# Patient Record
Sex: Female | Born: 1940 | Race: White | Hispanic: No | Smoking: Former smoker
Health system: Southern US, Community
[De-identification: ages and names within clinical notes are randomized; demographics above are authoritative.]

## PROBLEM LIST (undated history)

## (undated) ENCOUNTER — Ambulatory Visit: Discharge status: Absent without leave | Payer: Medicare Other

## (undated) DIAGNOSIS — N809 Endometriosis, unspecified: Secondary | ICD-10-CM

## (undated) DIAGNOSIS — K589 Irritable bowel syndrome without diarrhea: Secondary | ICD-10-CM

## (undated) DIAGNOSIS — E559 Vitamin D deficiency, unspecified: Secondary | ICD-10-CM

## (undated) DIAGNOSIS — Z79899 Other long term (current) drug therapy: Secondary | ICD-10-CM

## (undated) DIAGNOSIS — E039 Hypothyroidism, unspecified: Secondary | ICD-10-CM

## (undated) DIAGNOSIS — I10 Essential (primary) hypertension: Secondary | ICD-10-CM

## (undated) DIAGNOSIS — R42 Dizziness and giddiness: Secondary | ICD-10-CM

## (undated) DIAGNOSIS — H3530 Unspecified macular degeneration: Secondary | ICD-10-CM

## (undated) HISTORY — PX: THYROIDECTOMY: SHX17

## (undated) HISTORY — DX: Endometriosis, unspecified: N80.9

## (undated) HISTORY — DX: Unspecified macular degeneration: H35.30

## (undated) HISTORY — DX: Dizziness and giddiness: R42

## (undated) HISTORY — DX: Vitamin D deficiency, unspecified: E55.9

## (undated) HISTORY — PX: ABDOMINAL HYSTERECTOMY: SHX81

## (undated) HISTORY — DX: Other long term (current) drug therapy: Z79.899

## (undated) HISTORY — DX: Essential (primary) hypertension: I10

## (undated) HISTORY — PX: CHOLECYSTECTOMY: SHX55

## (undated) HISTORY — PX: BREAST BIOPSY: SHX20

## (undated) HISTORY — PX: FOOT SURGERY: SHX648

---

## 1997-08-15 ENCOUNTER — Ambulatory Visit (HOSPITAL_COMMUNITY): Admission: RE | Admit: 1997-08-15 | Discharge: 1997-08-15 | Payer: Self-pay | Admitting: Obstetrics & Gynecology

## 1997-10-03 ENCOUNTER — Other Ambulatory Visit: Admission: RE | Admit: 1997-10-03 | Discharge: 1997-10-03 | Payer: Self-pay | Admitting: Otolaryngology

## 1997-10-23 ENCOUNTER — Ambulatory Visit (HOSPITAL_COMMUNITY): Admission: RE | Admit: 1997-10-23 | Discharge: 1997-10-23 | Payer: Self-pay | Admitting: Hematology and Oncology

## 1998-06-28 ENCOUNTER — Ambulatory Visit (HOSPITAL_COMMUNITY): Admission: RE | Admit: 1998-06-28 | Discharge: 1998-06-29 | Payer: Self-pay | Admitting: Otolaryngology

## 1999-12-30 ENCOUNTER — Encounter: Admission: RE | Admit: 1999-12-30 | Discharge: 1999-12-30 | Payer: Self-pay | Admitting: *Deleted

## 1999-12-30 ENCOUNTER — Encounter: Payer: Self-pay | Admitting: *Deleted

## 2000-01-05 ENCOUNTER — Encounter: Payer: Self-pay | Admitting: *Deleted

## 2000-01-05 ENCOUNTER — Encounter: Admission: RE | Admit: 2000-01-05 | Discharge: 2000-01-05 | Payer: Self-pay | Admitting: *Deleted

## 2001-01-05 ENCOUNTER — Encounter: Payer: Self-pay | Admitting: *Deleted

## 2001-01-05 ENCOUNTER — Encounter: Admission: RE | Admit: 2001-01-05 | Discharge: 2001-01-05 | Payer: Self-pay | Admitting: *Deleted

## 2001-08-11 ENCOUNTER — Ambulatory Visit (HOSPITAL_COMMUNITY): Admission: RE | Admit: 2001-08-11 | Discharge: 2001-08-11 | Payer: Self-pay | Admitting: Family Medicine

## 2001-08-11 ENCOUNTER — Encounter: Payer: Self-pay | Admitting: Family Medicine

## 2001-12-05 ENCOUNTER — Encounter: Payer: Self-pay | Admitting: General Surgery

## 2001-12-05 ENCOUNTER — Ambulatory Visit (HOSPITAL_COMMUNITY): Admission: RE | Admit: 2001-12-05 | Discharge: 2001-12-05 | Payer: Self-pay | Admitting: General Surgery

## 2002-01-02 ENCOUNTER — Ambulatory Visit (HOSPITAL_COMMUNITY): Admission: RE | Admit: 2002-01-02 | Discharge: 2002-01-02 | Payer: Self-pay | Admitting: General Surgery

## 2002-02-08 ENCOUNTER — Encounter: Admission: RE | Admit: 2002-02-08 | Discharge: 2002-02-08 | Payer: Self-pay | Admitting: Family Medicine

## 2002-02-08 ENCOUNTER — Encounter: Payer: Self-pay | Admitting: Family Medicine

## 2003-02-13 ENCOUNTER — Encounter: Payer: Self-pay | Admitting: Family Medicine

## 2003-02-13 ENCOUNTER — Encounter: Admission: RE | Admit: 2003-02-13 | Discharge: 2003-02-13 | Payer: Self-pay | Admitting: Family Medicine

## 2003-04-16 ENCOUNTER — Ambulatory Visit (HOSPITAL_COMMUNITY): Admission: RE | Admit: 2003-04-16 | Discharge: 2003-04-16 | Payer: Self-pay | Admitting: Internal Medicine

## 2003-04-16 ENCOUNTER — Encounter (INDEPENDENT_AMBULATORY_CARE_PROVIDER_SITE_OTHER): Payer: Self-pay | Admitting: Internal Medicine

## 2003-08-17 ENCOUNTER — Ambulatory Visit (HOSPITAL_COMMUNITY): Admission: RE | Admit: 2003-08-17 | Discharge: 2003-08-17 | Payer: Self-pay | Admitting: Family Medicine

## 2003-12-31 ENCOUNTER — Ambulatory Visit (HOSPITAL_COMMUNITY): Admission: RE | Admit: 2003-12-31 | Discharge: 2003-12-31 | Payer: Self-pay | Admitting: Orthopaedic Surgery

## 2004-02-14 ENCOUNTER — Encounter: Admission: RE | Admit: 2004-02-14 | Discharge: 2004-02-14 | Payer: Self-pay | Admitting: Obstetrics and Gynecology

## 2004-09-19 ENCOUNTER — Ambulatory Visit: Payer: Self-pay | Admitting: Internal Medicine

## 2004-09-24 ENCOUNTER — Ambulatory Visit (HOSPITAL_COMMUNITY): Admission: RE | Admit: 2004-09-24 | Discharge: 2004-09-24 | Payer: Self-pay | Admitting: Internal Medicine

## 2004-10-21 ENCOUNTER — Ambulatory Visit: Payer: Self-pay | Admitting: Internal Medicine

## 2005-02-26 ENCOUNTER — Encounter: Admission: RE | Admit: 2005-02-26 | Discharge: 2005-02-26 | Payer: Self-pay | Admitting: Obstetrics and Gynecology

## 2005-10-02 ENCOUNTER — Ambulatory Visit: Payer: Self-pay | Admitting: Internal Medicine

## 2005-10-08 ENCOUNTER — Ambulatory Visit (HOSPITAL_COMMUNITY): Admission: RE | Admit: 2005-10-08 | Discharge: 2005-10-08 | Payer: Self-pay | Admitting: Internal Medicine

## 2005-11-26 ENCOUNTER — Ambulatory Visit: Payer: Self-pay | Admitting: Internal Medicine

## 2005-11-26 ENCOUNTER — Ambulatory Visit (HOSPITAL_COMMUNITY): Admission: RE | Admit: 2005-11-26 | Discharge: 2005-11-26 | Payer: Self-pay | Admitting: Internal Medicine

## 2006-03-09 ENCOUNTER — Encounter: Admission: RE | Admit: 2006-03-09 | Discharge: 2006-03-09 | Payer: Self-pay | Admitting: Obstetrics and Gynecology

## 2006-05-18 ENCOUNTER — Ambulatory Visit: Payer: Self-pay | Admitting: Internal Medicine

## 2006-05-31 ENCOUNTER — Ambulatory Visit: Payer: Self-pay | Admitting: Internal Medicine

## 2007-01-13 ENCOUNTER — Ambulatory Visit: Payer: Self-pay | Admitting: Gastroenterology

## 2007-03-14 ENCOUNTER — Encounter: Admission: RE | Admit: 2007-03-14 | Discharge: 2007-03-14 | Payer: Self-pay | Admitting: Obstetrics and Gynecology

## 2007-04-04 ENCOUNTER — Encounter (INDEPENDENT_AMBULATORY_CARE_PROVIDER_SITE_OTHER): Payer: Self-pay | Admitting: Internal Medicine

## 2007-05-05 ENCOUNTER — Ambulatory Visit: Payer: Self-pay | Admitting: Internal Medicine

## 2007-12-08 ENCOUNTER — Ambulatory Visit: Payer: Self-pay | Admitting: Gastroenterology

## 2007-12-15 ENCOUNTER — Encounter: Payer: Self-pay | Admitting: Gastroenterology

## 2007-12-15 ENCOUNTER — Ambulatory Visit: Payer: Self-pay | Admitting: Gastroenterology

## 2007-12-15 ENCOUNTER — Ambulatory Visit (HOSPITAL_COMMUNITY): Admission: RE | Admit: 2007-12-15 | Discharge: 2007-12-15 | Payer: Self-pay | Admitting: Gastroenterology

## 2008-04-17 ENCOUNTER — Encounter: Admission: RE | Admit: 2008-04-17 | Discharge: 2008-04-17 | Payer: Self-pay | Admitting: Obstetrics and Gynecology

## 2008-04-27 ENCOUNTER — Other Ambulatory Visit: Admission: RE | Admit: 2008-04-27 | Discharge: 2008-04-27 | Payer: Self-pay | Admitting: Obstetrics and Gynecology

## 2008-05-01 ENCOUNTER — Ambulatory Visit (HOSPITAL_COMMUNITY): Admission: RE | Admit: 2008-05-01 | Discharge: 2008-05-01 | Payer: Self-pay | Admitting: Obstetrics & Gynecology

## 2008-05-08 ENCOUNTER — Ambulatory Visit (HOSPITAL_COMMUNITY): Admission: RE | Admit: 2008-05-08 | Discharge: 2008-05-08 | Payer: Self-pay | Admitting: Obstetrics & Gynecology

## 2008-11-05 ENCOUNTER — Ambulatory Visit: Payer: Self-pay | Admitting: Internal Medicine

## 2008-11-05 DIAGNOSIS — M545 Low back pain, unspecified: Secondary | ICD-10-CM | POA: Insufficient documentation

## 2008-11-05 DIAGNOSIS — M1990 Unspecified osteoarthritis, unspecified site: Secondary | ICD-10-CM | POA: Insufficient documentation

## 2008-11-05 DIAGNOSIS — M542 Cervicalgia: Secondary | ICD-10-CM | POA: Insufficient documentation

## 2008-11-05 DIAGNOSIS — E039 Hypothyroidism, unspecified: Secondary | ICD-10-CM | POA: Insufficient documentation

## 2008-11-05 DIAGNOSIS — K219 Gastro-esophageal reflux disease without esophagitis: Secondary | ICD-10-CM | POA: Insufficient documentation

## 2008-11-05 DIAGNOSIS — J309 Allergic rhinitis, unspecified: Secondary | ICD-10-CM | POA: Insufficient documentation

## 2008-11-06 ENCOUNTER — Ambulatory Visit (HOSPITAL_COMMUNITY): Admission: RE | Admit: 2008-11-06 | Discharge: 2008-11-06 | Payer: Self-pay | Admitting: Internal Medicine

## 2009-01-08 ENCOUNTER — Encounter (INDEPENDENT_AMBULATORY_CARE_PROVIDER_SITE_OTHER): Payer: Self-pay | Admitting: Internal Medicine

## 2009-04-12 ENCOUNTER — Emergency Department (HOSPITAL_COMMUNITY): Admission: EM | Admit: 2009-04-12 | Discharge: 2009-04-12 | Payer: Self-pay | Admitting: Emergency Medicine

## 2009-05-14 ENCOUNTER — Ambulatory Visit (HOSPITAL_COMMUNITY): Admission: RE | Admit: 2009-05-14 | Discharge: 2009-05-14 | Payer: Self-pay | Admitting: Obstetrics & Gynecology

## 2010-09-18 LAB — CBC
HCT: 36.5 % (ref 36.0–46.0)
Hemoglobin: 12.8 g/dL (ref 12.0–15.0)
MCHC: 35 g/dL (ref 30.0–36.0)
MCV: 100.8 fL — ABNORMAL HIGH (ref 78.0–100.0)
WBC: 6.1 10*3/uL (ref 4.0–10.5)

## 2010-09-18 LAB — TROPONIN I
Troponin I: 0.01 ng/mL (ref 0.00–0.06)
Troponin I: 0.01 ng/mL (ref 0.00–0.06)

## 2010-09-18 LAB — PROTIME-INR
INR: 0.97 (ref 0.00–1.49)
Prothrombin Time: 12.8 seconds (ref 11.6–15.2)

## 2010-09-18 LAB — URINALYSIS, ROUTINE W REFLEX MICROSCOPIC
Nitrite: NEGATIVE
Specific Gravity, Urine: 1.025 (ref 1.005–1.030)
Urobilinogen, UA: 0.2 mg/dL (ref 0.0–1.0)
pH: 5.5 (ref 5.0–8.0)

## 2010-09-18 LAB — DIFFERENTIAL
Basophils Relative: 1 % (ref 0–1)
Eosinophils Absolute: 0.5 10*3/uL (ref 0.0–0.7)
Lymphs Abs: 2 10*3/uL (ref 0.7–4.0)
Monocytes Absolute: 0.7 10*3/uL (ref 0.1–1.0)
Monocytes Relative: 11 % (ref 3–12)
Neutrophils Relative %: 48 % (ref 43–77)

## 2010-09-18 LAB — COMPREHENSIVE METABOLIC PANEL
Alkaline Phosphatase: 59 U/L (ref 39–117)
BUN: 14 mg/dL (ref 6–23)
Creatinine, Ser: 0.68 mg/dL (ref 0.4–1.2)
Glucose, Bld: 109 mg/dL — ABNORMAL HIGH (ref 70–99)
Sodium: 141 mEq/L (ref 135–145)
Total Protein: 6.3 g/dL (ref 6.0–8.3)

## 2010-09-18 LAB — CK TOTAL AND CKMB (NOT AT ARMC)
CK, MB: 1.2 ng/mL (ref 0.3–4.0)
Total CK: 60 U/L (ref 7–177)

## 2010-09-18 LAB — URINE CULTURE: Colony Count: NO GROWTH

## 2010-09-25 ENCOUNTER — Other Ambulatory Visit: Payer: Self-pay | Admitting: Obstetrics & Gynecology

## 2010-09-25 DIAGNOSIS — M8580 Other specified disorders of bone density and structure, unspecified site: Secondary | ICD-10-CM

## 2010-10-01 ENCOUNTER — Ambulatory Visit (HOSPITAL_COMMUNITY)
Admission: RE | Admit: 2010-10-01 | Discharge: 2010-10-01 | Disposition: A | Discharge status: Home / Self Care | Payer: Medicare Other | Source: Ambulatory Visit | Attending: Obstetrics & Gynecology | Admitting: Obstetrics & Gynecology

## 2010-10-01 ENCOUNTER — Encounter (HOSPITAL_COMMUNITY): Payer: Self-pay

## 2010-10-01 DIAGNOSIS — M899 Disorder of bone, unspecified: Secondary | ICD-10-CM | POA: Insufficient documentation

## 2010-10-01 DIAGNOSIS — M8580 Other specified disorders of bone density and structure, unspecified site: Secondary | ICD-10-CM

## 2010-10-28 NOTE — H&P (Signed)
NAMEFRANCYNE, Tiffany Kim                ACCOUNT NO.:  [id number]   MEDICAL RECORD NO.:  [id number]          PATIENT TYPE:  AMB   LOCATION:  DAY                           FACILITY:  [facility]   PHYSICIAN:  Kassie Mends, M.D.      DATE OF BIRTH:  1941-02-18   DATE OF ADMISSION:  DATE OF DISCHARGE:  LH                              HISTORY & PHYSICAL   CHIEF COMPLAINT:  Alternating constipation, diarrhea, needs colonoscopy.   HISTORY OF PRESENT ILLNESS:  Tiffany Kim is here to schedule colonoscopy.  She has a history of alternating constipation, diarrhea felt to be due  to IBS.  She was last seen on January 13, 2007.  Her last colonoscopy was  in 2000.  She had a redundant colon with few diverticula in the sigmoid  region.  She was told to come back in 8 years.  Her mother was diagnosed  with colorectal cancer at age 68.  She received chemotherapy and doing  well now at age 68.  She denies any blood in the stool or melena.  She  has intermittent left lower quadrant abdominal pain which is chronic.  She takes MiraLax as needed for constipation.  Denies any heartburn  symptoms.  She states she never had any heartburn symptoms.  She had an  EGD back in 2004 for epigastric pain.  She had nonerosive antral  gastritis.  H. pylori serologies were positive and she underwent  treatment.  She denies any dysphagia or odynophagia.  At one point, she  had cough, hoarseness, and sensation of a lump in her throat and was put  on a PPI by ENT.  Denies nausea or vomiting.   CURRENT MEDICATIONS:  1. Synthroid 100 mcg daily.  2. Vivelle-Dot patch changed on Sundays and Thursdays.  3. Vitamins daily.  4. Nexium 40 mg daily.  5. Aspirin 81 mg daily.  6. Flax seed oil daily.  7. Calcium 600 mg.  8. Vitamin D b.i.d.  9. Thyroid daily.  10.MiraLax p.r.n.   ALLERGIES:  CODEINE.   PAST MEDICAL HISTORY:  1. EGD and colonoscopy as above.  2. History of partial thyroidectomy, now on supplements.  3. History of H. pylori  positive status post treatment IBS.  4. Cholecystectomy.  5. Hysterectomy.  6. Right foot surgery.  7. Benign left breast lumpectomy.   FAMILY HISTORY:  Mother was diagnosed at age 68 of colon cancer,  underwent resection and chemotherapy, and is now doing well, age 19.  She has had MI.  Father deceased at age 60 with heart related problems.  Sister with COPD and with breast cancer.   SOCIAL HISTORY:  She is married.  She has 2 children.  She is somewhat  retired.  She is expecting her first grand child.  She quit smoking over  30 years ago.  She consumes some moderate amount of wine each night.   REVIEW OF SYSTEMS:  See HPI for GI.  CONSTITUTIONAL:  No weight loss.  CARDIOPULMONARY:  No chest pain or shortness of breath.  GENITOURINARY:  No dysuria or hematuria.   PHYSICAL  EXAMINATION:  VITAL SIGNS:  Weight 124 stable, temperature  97.8, blood pressure 120/80, pulse 62.  GENERAL:  Pleasant, well-nourished, well-developed Caucasian female in  no acute distress.  SKIN:  Warm and dry.  No jaundice.  HEENT:  Sclerae nonicteric.  Oropharyngeal mucosa moist and pink.  No  lesions, erythema, or exudate.  No lymphadenopathy or thyromegaly.  CHEST:  Lungs are clear to auscultation.  CARDIAC:  Regular rate and rhythm.  Normal S1 and S2.  No murmurs, rubs,  or gallops.  ABDOMEN:  Positive bowel sounds.  Abdomen is soft, nontender, and  nondistended.  No organomegaly or masses.  No rebound or guarding.  No  abdominal bruits or hernias.  LOWER EXTREMITIES:  No edema.   IMPRESSION:  1. Tiffany Kim is a 70 year old lady with chronic intermittent      alternating constipation, diarrhea, this is likely due to irritable      bowel syndrome.  Symptoms are stable.  She is a having good results      with MiraLax for constipation.  2. Family history of colorectal cancer first-degree relative, mother      at age 68.  She is due for followup of colonoscopy at this time.  3. History of  gastroesophageal reflux disease possibly with      laryngopharyngeal reflux in the past.  She has had no problems and      at questions whether or not she really needs to be on the      medication.   PLAN:  1. Trial off Nexium.  2. Colonoscopy with Dr. Kassie Mends in the near future.      Tana Coast, P.A.      Kassie Mends, M.D.  Electronically Signed   LL/MEDQ  D:  12/08/2007  T:  12/09/2007  Job:  [id number]   cc:   Erle Crocker, M.D.

## 2010-10-28 NOTE — Op Note (Signed)
NAMERANITA, STJULIEN                ACCOUNT NO.:  [id number]   MEDICAL RECORD NO.:  [id number]          PATIENT TYPE:  AMB   LOCATION:  DAY                           FACILITY:  [facility]   PHYSICIAN:  Kassie Mends, M.D.      DATE OF BIRTH:  [birth year]   DATE OF PROCEDURE:  12/15/2007  DATE OF DISCHARGE:                               OPERATIVE REPORT   REFERRING PHYSICIAN:  Erle Crocker, MD   PROCEDURE:  Colonoscopy with random cold forceps biopsy.   INDICATION FOR EXAM:  Ms. Selvey is a 70 year old female who complains of  intermittent constipation and diarrhea.   FINDINGS:  1. Extremely tortuous sigmoid colon, requiring multiple changes in      position and pressure to achieve successful intubation of the      cecum. The patient's colonoscopy was performed with a pediatric      colonoscope.  2. Rare right colon diverticula.  Biopsies obtained via cold forceps      to evaluate for microscopic colitis.  Otherwise, no polyps, masses,      inflammatory changes, or diverticular AVM seen.  3. Normal retroflexed view of the rectum.   RECOMMENDATIONS:  1. Will call Ms. Gully with the results of her biopsies.  2. She should add Benefiber twice daily.  3. She should drink 6-8 cups of water daily.  4. She should use MiraLax every other day.  5. She should follow a high-fiber diet.  She is given a handout on      high-fiber diet and diverticulosis.  6. No aspirin, NSAIDs, or anticoagulation for 5 days.   MEDICATIONS:  1. Demerol 50 mg IV.  2. Versed 5 mg IV.  3. Phenergan 6.25 mg IV.   PROCEDURE TECHNIQUE:  Physical exam was performed.  Informed consent was  obtained from the patient after explaining the benefits, risks, and  alternatives to the procedure.  The patient was connected to the monitor  and placed in the left lateral position.  Continuous oxygen was provided  by nasal cannula, and IV medicine was administered through an indwelling  cannula.  After administration of sedation  and rectal exam, the  patient's rectum was intubated and the scope was advanced under direct  visualization to the cecum.  The scope was removed slowly  by carefully examining the color, texture, anatomy, and integrity of the  mucosa on the way out.  The patient was recovered in endoscopy and  discharged home in satisfactory condition.   PATH:  nl colon      Kassie Mends, M.D.  Electronically Signed     SM/MEDQ  D:  12/15/2007  T:  12/16/2007  Job:  [id number]   cc:   Erle Crocker, M.D.

## 2010-10-28 NOTE — Assessment & Plan Note (Signed)
NAMEROSHAWN, LACINA                 CHART#:  [id number]   DATE:  01/13/2007                       DOB:  [birth year]   CHIEF COMPLAINT:  Medicine refills, IBS.   SUBJECTIVE:  The patient is here for followup.  She was last seen in May  2006.  She has a history of post prandial abdominal pain, abdominal  distention, with alternating diarrhea and constipation.  She is felt to  have IBS.  She has done extremely well on Levbid 1 tablet daily.  Recently, she has been unable to obtain the medication as it is no  longer being produced in that formulary.  She has been off medication  now a month and has had recurrent abdominal bloating and discomfort.  Otherwise, she denies any melena or rectal bleeding.  She has tried  [employer] for IBS for 1 week with no noticed results.  She  does not have heartburn.  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  She does have a family  history of colorectal cancer with her mother.  The patient's last  colonoscopy was in 2000, and she had a redundant colon with few  diverticula in the sigmoid colon but otherwise unremarkable.  She was  told to come back in 2008.   CURRENT MEDICATIONS:  1. Synthroid 100 mcg daily.  2. Vivelle dot patch change on Sundays and Thursdays.  3. Multivitamin daily.  4. Nexium 40 mg daily.  5. Aspirin 81 mg daily.  6. Flax seed oil daily.  7. Calcium with vitamin D b.i.d.  8. Fiber daily.   ALLERGIES:  No known drug allergies, except for CODEINE and other pain  medications causing nausea.   PHYSICAL EXAMINATION:  VITAL SIGNS:  Weight 125, down 3 pounds from  2006.  Temp 97.8, blood pressure 138/82, pulse 56.  GENERAL:  A pleasant, well nourished, well developed Caucasian female in  no acute distress.  SKIN:  Warm and dry.  No jaundice.  HEENT:  Sclerae are nonicteric.  Oropharyngeal mucosa is moist and pink.  No lesions, erythema or exudate.  No lymphadenopathy,  thyromegaly.  CHEST/LUNGS:  Clear to auscultation.  CARDIAC:  Reveals a regular rate and rhythm.  Normal S1, S2.  No  murmurs, rubs, or gallops.  ABDOMEN:  Positive bowel sounds.  Soft, nontender, nondistended.  No  organomegaly or masses.  No rebound tenderness or guarding.  No  abdominal bruits or hernias.  EXTREMITIES:  No edema.   IMPRESSION:  1. The patient is a 70 year old lady with a history of irritable bowel      syndrome who was doing very well on Levbid but has had recurrent      symptoms now that she has been off the medication for 1 month.  She      also has a family history of colorectal cancer and is due for a      colonoscopy herself this year.  2. 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   PLAN:  1. Hyomax SR 0.375 mg, #30, one p.o. daily called to [facility].  2. The patient would like to postpone her colonoscopy until later in      the year.  She will call us when she is ready  to schedule this.  3. Continue Nexium 40 mg daily.       Tana Coast, P.A.  Electronically Signed     Kassie Mends, M.D.  Electronically Signed    LL/MEDQ  D:  01/13/2007  T:  01/13/2007  Job:  [id number]

## 2010-10-31 NOTE — Op Note (Signed)
[name], Tiffany Kim                          ACCOUNT NO.:  [id number]   MEDICAL RECORD NO.:  [id number]                   PATIENT TYPE:  AMB   LOCATION:  DAY                                  FACILITY:  [facility]   PHYSICIAN:  Lionel December, M.D.                 DATE OF BIRTH:  Nov 05, 1940   DATE OF PROCEDURE:  04/16/2003  DATE OF DISCHARGE:                                 OPERATIVE REPORT   PROCEDURE:  Esophagogastroduodenoscopy.   INDICATIONS:  [name] is a 70 year old Caucasian female with a four-month  history of epigastric pain which also wakes her up at night.  She is on a  PPI which has helped with the regurgitation symptoms but not with the pain.  She is also on Bextra and prior to that she was on Vioxx.  She is undergoing  EGD to find out whether or not she has GPUD.  The procedure and risks were  reviewed with the patient and informed consent was obtained.   PREOPERATIVE MEDICATIONS:  Cetacaine spray for pharyngeal topical  anesthesia, fentanyl 25 mcg IV, Versed 7 mg IV in divided dose.   FINDINGS:  The procedure performed in endoscopy suite.  The patient's vital  signs and O2 saturations were monitored during the procedure and remained  stable.  The patient was placed in the left lateral position and the Olympus  video scope was passed via the oropharynx without any difficulty into the  esophagus.   Esophagus:  The mucosa of the esophagus was normal throughout.  The  squamocolumnar junction was unremarkable.  No hernia was noted.   Stomach:  It was empty and distended very well with insufflation.  The folds  of the proximal stomach were normal.  Examination of the mucosa revealed  some granularity at the antrum and a few patches of erythematous mucosa,  however, no erosions or ulcers were noted.  The pyloric channel was patent.  The angularis, fundus and cardia were examined by retroflexion of the scope  and were normal.   Duodenum:  Examination of the bulb revealed  normal mucosa.  Examination of  postbulbar duodenum was also normal.   The endoscope was withdrawn.  The patient tolerated the procedure well.   FINAL DIAGNOSES:  Nonerosive antral gastritis, otherwise normal  esophagogastroduodenoscopy.   RECOMMENDATIONS:  1. Helicobacter pylori serology to be checked today along with LFTs.  2.     Carafate 2 g p.o. q.h.s.  3. If at all possible, I would like [name] to stop her Bextra just for two     weeks to see if her pain goes away completely, otherwise she will need     abdominal CT, presuming lab studies are normal.      ___________________________________________  Lionel December, M.D.   NR/MEDQ  D:  04/16/2003  T:  04/16/2003  Job:  [id number]   cc:   Corrie Mckusick, M.D.  [address] [address]  [place]  [place] [address]  Fax: [phone]

## 2010-10-31 NOTE — Op Note (Signed)
Tiffany Kim, Tiffany Kim                          ACCOUNT NO.:  [id number]   MEDICAL RECORD NO.:  [id number]                   PATIENT TYPE:  AMB   LOCATION:  DAY                                  FACILITY:  [facility]   PHYSICIAN:  Dalia Heading, M.D.               DATE OF BIRTH:  [birth year]   DATE OF PROCEDURE:  DATE OF DISCHARGE:  01/02/2002                                 OPERATIVE REPORT   PREOPERATIVE DIAGNOSIS:  Chronic cholecystitis.   POSTOPERATIVE DIAGNOSIS:  Chronic cholecystitis.   PROCEDURE:  Laparoscopic cholecystectomy.   SURGEON:  Dalia Heading, M.D.   ASSISTANT:  Bernerd Limbo. Leona Carry, M.D.   ANESTHESIA:  General endotracheal.   INDICATIONS:  The patient is a 70 year old, white female who presents with  chronic cholecystitis.  The risks and benefits of the procedure including  bleeding, infection, hepatobiliary injury and the possibility of an open  procedure were fully explained to the patient, gaining informed consent.   DESCRIPTION OF PROCEDURE:  The patient was placed in the supine position.  After induction of general endotracheal anesthesia, the abdomen was prepped  and draped using the usual sterile technique with Betadine.   An infraumbilical incision was made down to the fascia.  A Veress needle was  then introduced into the abdominal cavity and confirmation of placement was  done using the saline drop test.  The abdomen was then insufflated to 16  mmHg.  An 11 mm trocar was then introduced into the abdominal cavity under  direct visualization without difficulty.  The patient was placed in the  reverse Trendelenburg position and an additional 11 mm trocar was placed in  the epigastric region.  A 5 mm trocar was then placed in the right upper  quadrant and right flank regions.  The liver was inspected and noted to be  within normal limits.  The gallbladder was retracted superiorly and  laterally.  The dissection began around the infundibulum of the  gallbladder.   The cystic duct was first identified.  Its juncture to the infundibulum  fully identified.  Endoclips were placed proximally and distally on the  cystic duct and the cystic duct was divided.  This was likewise done to the  cystic artery.  The gallbladder was then freed away from the gallbladder  fossa using Bovie electrocautery.  The gallbladder was delivered through the  upper trocar site without difficulty.  Gallbladder fossa was inspected and  no abnormal bleeding or bile leakage was noted.  Surgicel was placed in the  gallbladder fossa.  The subhepatic space as well as hepatic gutter were  evacuated of all fluid and air.  All trocars were then removed.  All wounds  were irrigated with normal saline.  All wounds where injected with 0.5%  Sensorcaine.  The infraumbilical fascia was reapproximated using an  #0Vicryl, interrupted suture.  All skin incisions were closed using staples.  Betadine  ointment and dry sterile dressing were applied.   All tape and needle counts were correct at the end of the procedure.  The  patient was extubated in the operating room and went back to the recovery  room awake in stable condition.   COMPLICATIONS:  None.   SPECIMENS:  Gallbladder.   ESTIMATED BLOOD LOSS:  Minimal.                                               Dalia Heading, M.D.    MAJ/MEDQ  D:  01/02/2002  T:  01/12/2002  Job:  [id number]   cc:   Corrie Mckusick, M.D.

## 2010-10-31 NOTE — H&P (Signed)
NAME:  Tiffany Kim, Tiffany Kim NO.:  [id number]   MEDICAL RECORD NO.:  [id number]                  PATIENT TYPE:   LOCATION:                                       FACILITY:   PHYSICIAN:  Lionel December, M.D.                 DATE OF BIRTH:  05-14-1941   DATE OF ADMISSION:  04/04/2003  DATE OF DISCHARGE:                                HISTORY & PHYSICAL   CHIEF COMPLAINT:  Upper abdominal pain, abdominal pain bloating.   HISTORY OF PRESENT ILLNESS:  Tiffany Kim is a 70 year old Caucasian female seen  previously by our practice for a colonoscopy who presents today with  complaints of epigastric pain and abdominal bloating.  She has had  epigastric pain off and on for over a year.  Originally it was felt that it  was her gallbladder and she had her gallbladder removed by Dr. Lovell Sheehan in  August 2003.  She did not have any cholelithiasis.  The patient states she  did better up until three to four months ago when she started noticing  epigastric pain.  It is not necessarily related to meals.  She wakes up in  the middle of the night with epigastric pain at times.  She has been on  Aciphex for over a year for reflux-type symptoms.  These symptoms are still  well controlled as long as she takes the medication.  She has also been on  Vioxx for several months and most recently been on Bextra for the last three  to four months.  She tried decreasing the dose to every-other day to see if  this helped her stomach but she had flare-up of her arthritis when she did  this.  She noted no improvement of her stomach either.  She started taking  Zantac 150 mg in the evening which seems to help some.  Bowels are moving  regularly, at least four to five times weekly.  Denies any melena or rectal  bleeding.   CURRENT MEDICATIONS:  1. Bextra 20 mg daily.  2. Synthroid 100 mcg daily.  3. Aciphex 20 mg daily.  4. Ranitidine 150 mg q.h.s.  5. Vivelle-Dot patch change every three  days.  6. Multivitamin daily.  7. Vitamin E and vitamin C daily.  8. Potassium q.h.s.   ALLERGIES:  PAIN MEDICATIONS, especially CODEINE.   PAST MEDICAL HISTORY:  1. Hypothyroidism.  2. Gastroesophageal reflux disease.  3. History of heart murmur; however, does not require antibiotics for dental     work.  4. Arthritis.   PAST SURGICAL HISTORY:  1. Partial thyroidectomy.  2. Hysterectomy.  3. Cholecystectomy.  4. Foot surgery.  5. Eye surgery.  6. Colonoscopy in May 2000 which revealed a redundant colon with few     diverticula in the sigmoid colon.  Recommended eight-year follow-up     colonoscopy.   FAMILY HISTORY:  Mother has heart disease;  she has a history of colon  cancer.  Father died of CHF.  She has a brother with history of stomach  ulcers.  A sister who has history of stomach problems.   SOCIAL HISTORY:  She is married for forty-one-and-a-half years.  She has two  children.  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  She  quit smoking over 25 years ago.  She drinks wine each evening.   REVIEW OF SYSTEMS:  Please see HPI for GI.  GENERAL:  Denies any weight  loss.  CARDIOPULMONARY:  Chronic heart murmur, does not require antibiotic  therapy per report.  No chest pain or shortness of breath.   PHYSICAL EXAMINATION:  VITAL SIGNS:  Weight 127, height 5 feet 4 inches.  Temperature 97.4, blood pressure 140/90, pulse 68.  GENERAL:  Pleasant, well-nourished, well-developed Caucasian female in no  acute distress.  SKIN:  Warm and dry, no jaundice.  HEENT:  Conjunctivae are pink, sclerae nonicteric.  Oropharyngeal mucosa  moist and pink; no lesions, erythema, or exudate.  No lymphadenopathy,  thyromegaly.  CHEST:  Lungs are clear to auscultation.  CARDIAC:  Reveals regular rate and rhythm, normal S1, S2, with a 2/6  systolic ejection murmur heard best in the upper precordium.  ABDOMEN:  Positive bowel sounds, soft, nontender, nondistended.  No   organomegaly or masses.  EXTREMITIES:  No edema.   IMPRESSION:  Tiffany Kim is a pleasant 70 year old lady with a three- to four-  month history of worsening epigastric pain and abdominal bloating.  She has  been on a proton pump inhibitor for the last one year and recently started  an H2 blocker.  In addition, she has been on COX-2 inhibitors which is  somewhat concerning.  I have discussed with her today that she may be having  dyspepsia but would like to rule out peptic ulcer disease.  I recommended  upper endoscopy.  I have discussed risks, alternatives, and benefits with  the patient and she is agreeable to proceed.   PLAN:  1. EGD in the near future.  2. She will continue Aciphex in the morning and Zantac in the evening for     now.  If her symptoms are more progressive she may increase her Aciphex     to one tablet b.i.d. and stop the Zantac.     _____________________________________  ___________________________________________  Tana Coast, P.A.                      Lionel December, M.D.   LL/MEDQ  D:  04/04/2003  T:  04/04/2003  Job:  [id number]   cc:   Corrie Mckusick, M.D.  [address] [place] [place]  [place]   [address]  Fax: [phone]

## 2010-11-28 IMAGING — CT CT ABDOMEN W/ CM
2 of 5 series · 16 of 46 positions shown, 18 images · IV contrast (Omnipaque 300)
Comparison: CT abdomen and pelvis 09/24/2004.

CT ABDOMEN

CLINICAL DATA: Right upper quadrant pain.  Vomiting.

CT ABDOMEN AND PELVIS WITH CONTRAST
TECHNIQUE: Multidetector CT imaging of the abdomen and pelvis was
performed using the standard protocol following bolus
administration of intravenous contrast.
Contrast: 100 ml Hmnipaque-IEE.

[Series 2: abd_pel_with 5.0 b40f · axial · 0.59mm/px · z∈[-435,-80]mm · 13 of 81 slices shown, 15 images]
[im 5/81  soft-tissue]
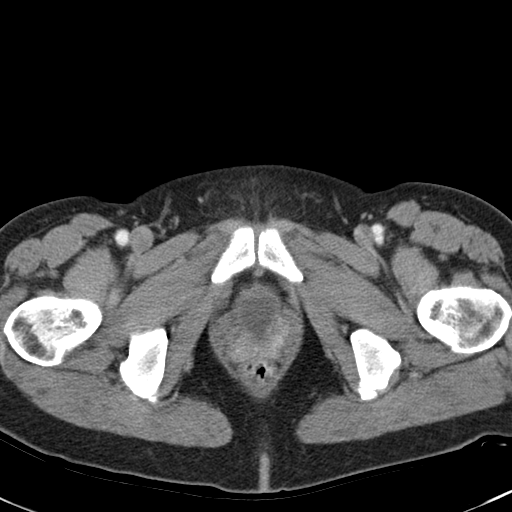
[im 5/81  bone]
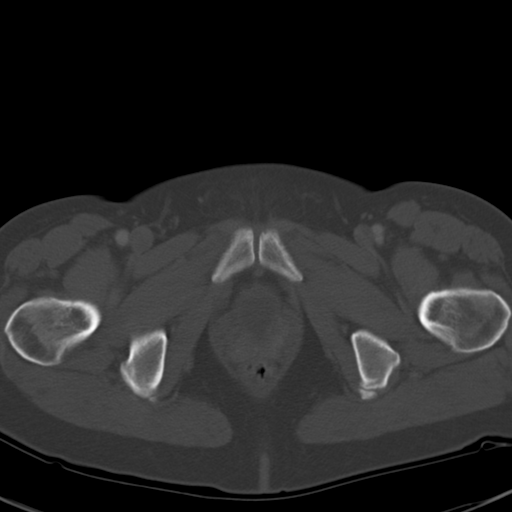
[im 9/81  soft-tissue]
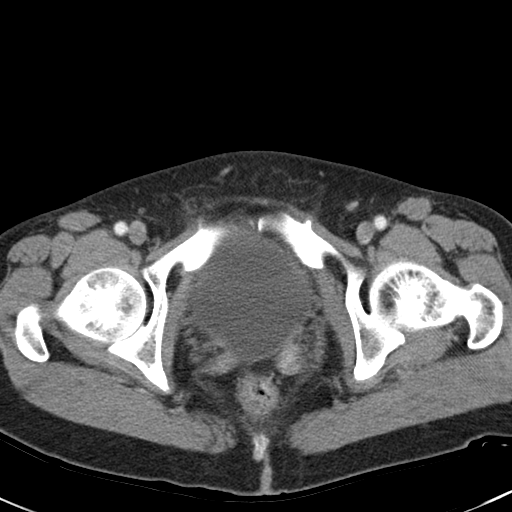
[im 18/81  soft-tissue]
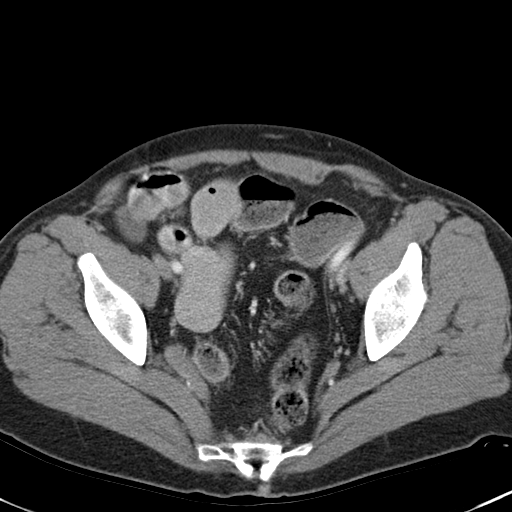
[im 23/81  soft-tissue]
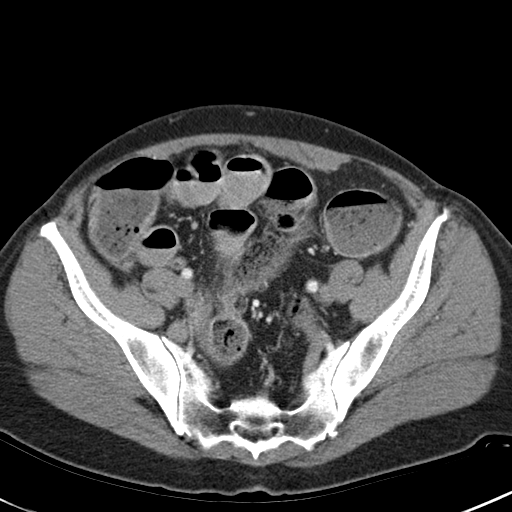
[im 27/81  soft-tissue]
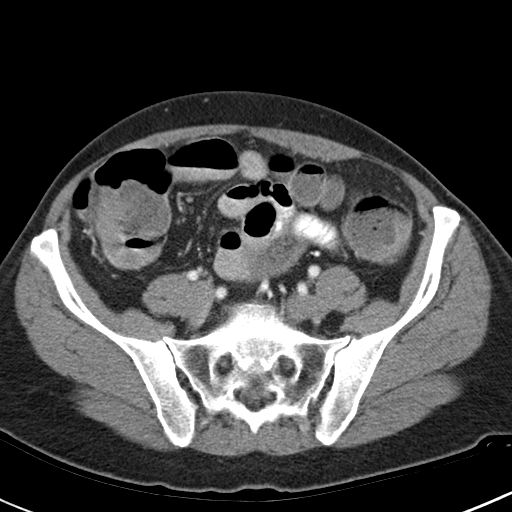
[im 36/81  soft-tissue]
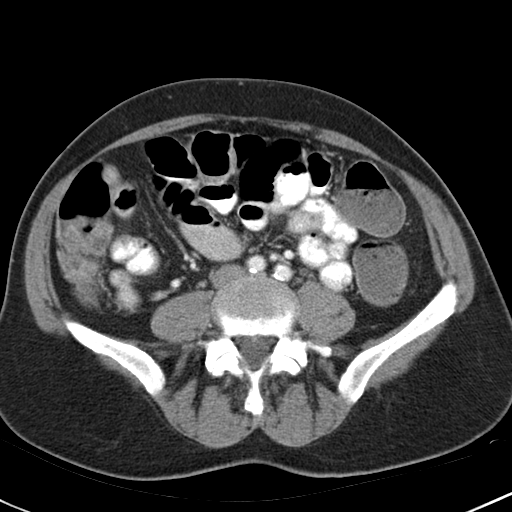
[im 41/81  soft-tissue]
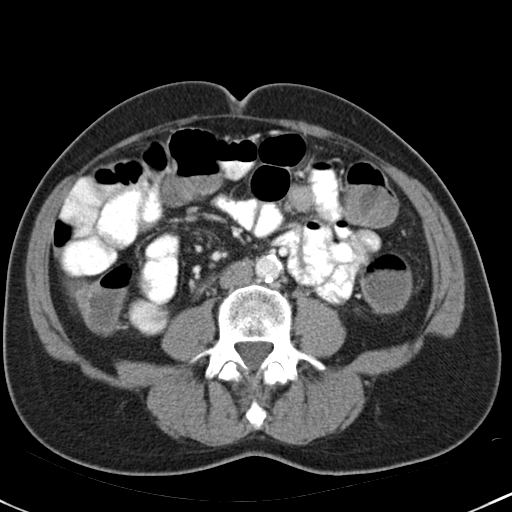
[im 45/81  soft-tissue]
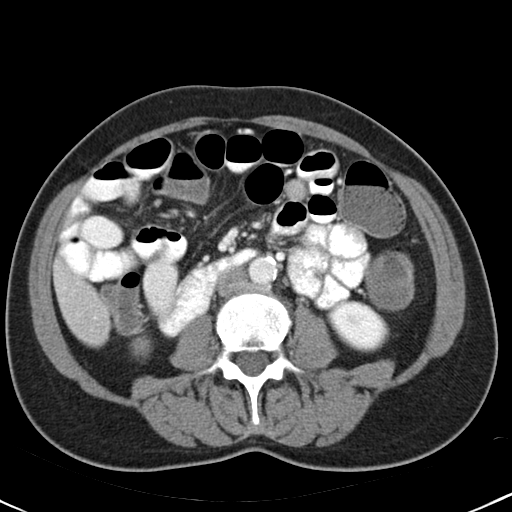
[im 54/81  soft-tissue]
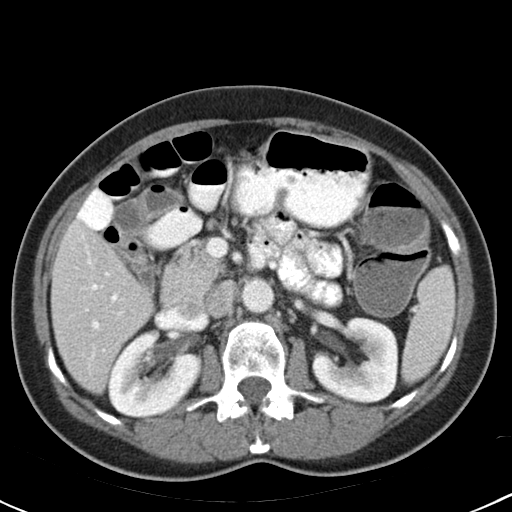
[im 54/81  bone]
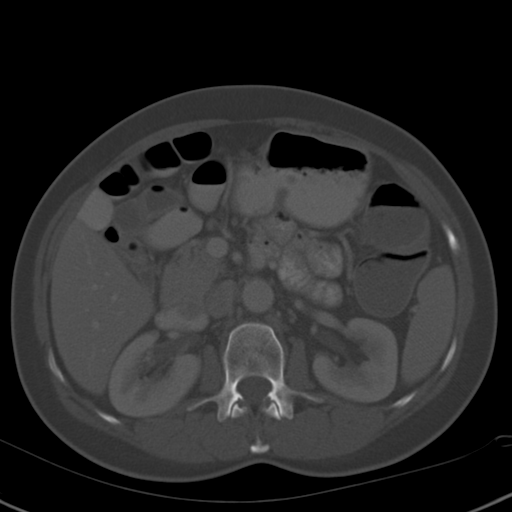
[im 58/81  soft-tissue]
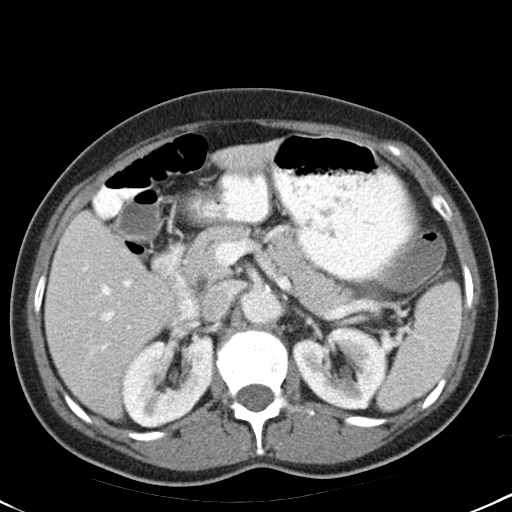
[im 63/81  soft-tissue]
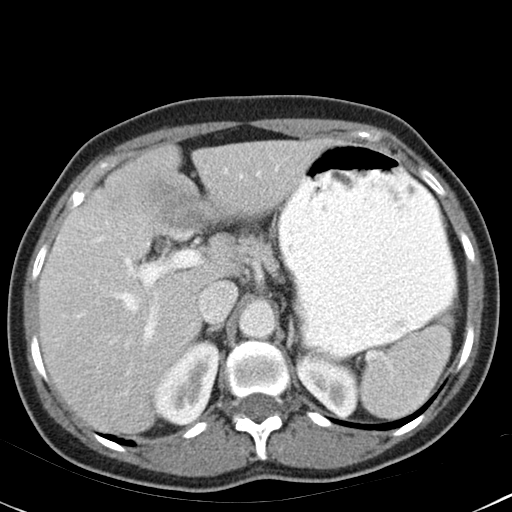
[im 72/81  soft-tissue]
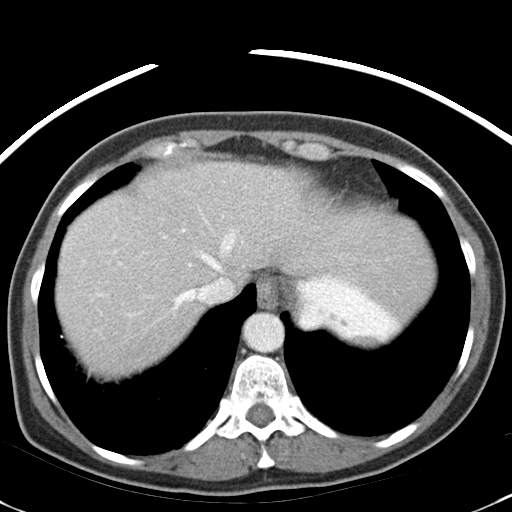
[im 76/81  soft-tissue]
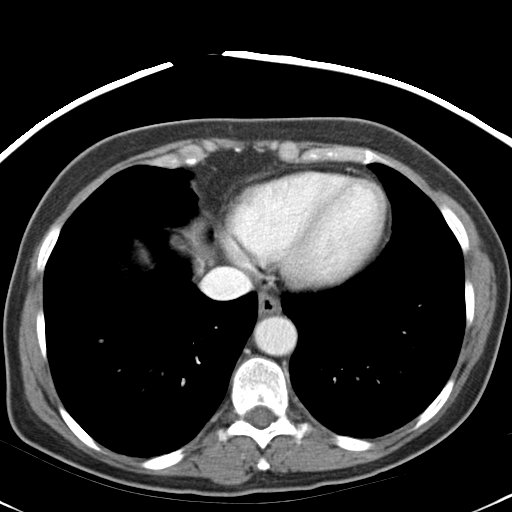

[Series 4: mpr cor post contrast (id) · coronal · 0.63mm/px · 3 of 74 slices shown]
[im 25/74  soft-tissue]
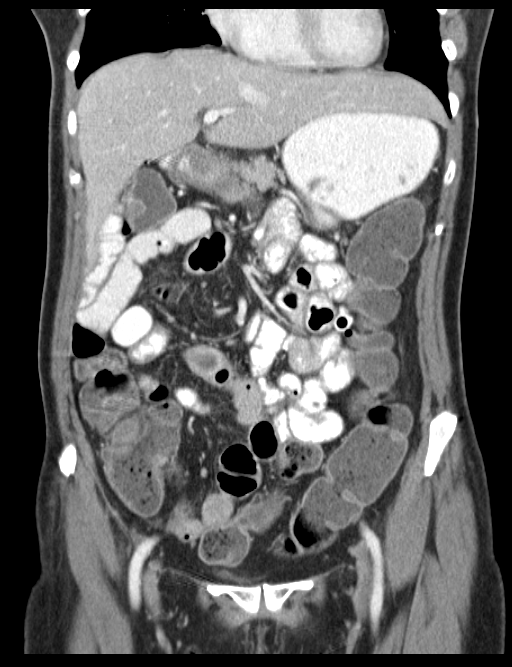
[im 33/74  soft-tissue]
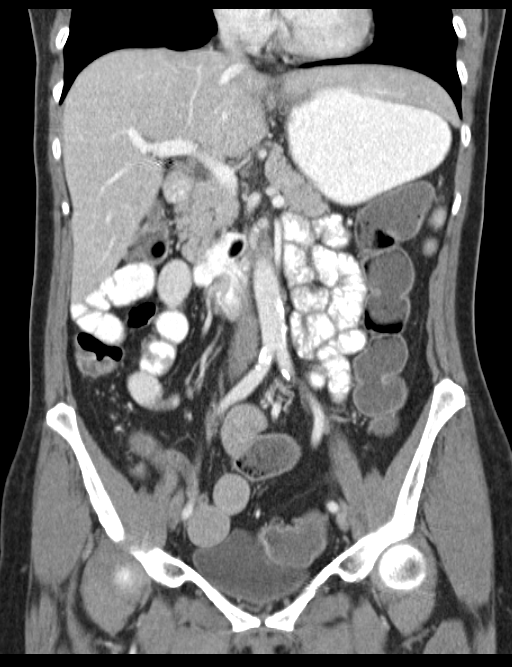
[im 41/74  soft-tissue]
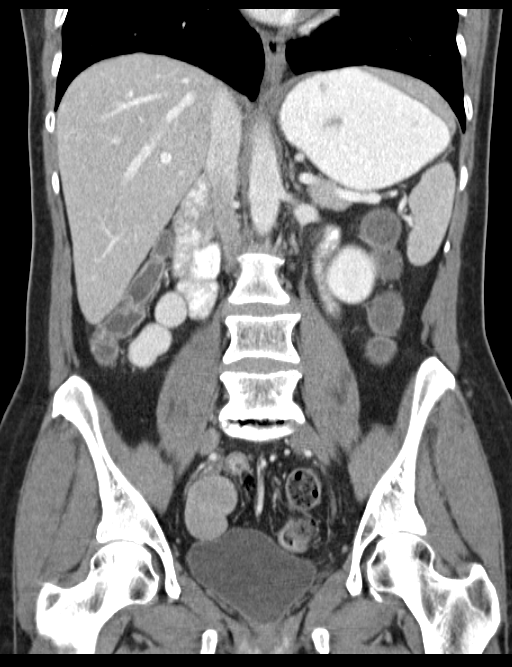

[16 of 46 positions shown; findings below may reference images not displayed]

FINDINGS: The lung bases are clear.  There is no pleural or
pericardial effusion.

The patient is status post cholecystectomy.  The liver, biliary
tree, spleen, pancreas, adrenal glands and kidneys all appear
normal.  Surgical clip along the dome of the liver is unchanged.
Small bowel is mildly prominent with some short air fluid levels
identified but no transition point to suggest small bowel
obstruction identified.  Gas and stool are present throughout the
colon.  A tiny amount of perihepatic ascites identified.  There is
no pneumatosis or free intraperitoneal air.  No focal bony
abnormality.
IMPRESSION: 1.  Tiny amount of perihepatic ascites with mild prominence of
small bowel loops without evidence of obstruction may be due to
gastroenteritis.
2.  Status post cholecystectomy.

CT PELVIS
FINDINGS: The patient is status post hysterectomy.  The colon is
unremarkable.  The appendix is well seen and appears normal.  No
pelvic fluid or lymphadenopathy.  No focal bony abnormality.
IMPRESSION: No acute finding in the pelvis.

## 2011-08-03 DIAGNOSIS — N8189 Other female genital prolapse: Secondary | ICD-10-CM | POA: Diagnosis not present

## 2011-08-03 DIAGNOSIS — N816 Rectocele: Secondary | ICD-10-CM | POA: Diagnosis not present

## 2011-08-03 DIAGNOSIS — N3946 Mixed incontinence: Secondary | ICD-10-CM | POA: Diagnosis not present

## 2011-08-26 DIAGNOSIS — M2040 Other hammer toe(s) (acquired), unspecified foot: Secondary | ICD-10-CM | POA: Diagnosis not present

## 2011-08-26 DIAGNOSIS — M2010 Hallux valgus (acquired), unspecified foot: Secondary | ICD-10-CM | POA: Diagnosis not present

## 2011-08-26 DIAGNOSIS — M79609 Pain in unspecified limb: Secondary | ICD-10-CM | POA: Diagnosis not present

## 2011-10-13 DIAGNOSIS — M2040 Other hammer toe(s) (acquired), unspecified foot: Secondary | ICD-10-CM | POA: Diagnosis not present

## 2011-10-13 DIAGNOSIS — M2010 Hallux valgus (acquired), unspecified foot: Secondary | ICD-10-CM | POA: Diagnosis not present

## 2011-10-15 ENCOUNTER — Encounter (HOSPITAL_COMMUNITY): Payer: Self-pay

## 2011-10-15 ENCOUNTER — Encounter (HOSPITAL_COMMUNITY)
Admission: RE | Admit: 2011-10-15 | Discharge: 2011-10-15 | Disposition: A | Discharge status: Home / Self Care | Payer: Medicare Other | Source: Ambulatory Visit | Attending: Podiatry | Admitting: Podiatry

## 2011-10-15 ENCOUNTER — Ambulatory Visit (HOSPITAL_COMMUNITY)
Admission: RE | Admit: 2011-10-15 | Discharge: 2011-10-15 | Disposition: A | Discharge status: Home / Self Care | Payer: Medicare Other | Source: Ambulatory Visit | Attending: Internal Medicine | Admitting: Internal Medicine

## 2011-10-15 DIAGNOSIS — R269 Unspecified abnormalities of gait and mobility: Secondary | ICD-10-CM | POA: Diagnosis not present

## 2011-10-15 DIAGNOSIS — IMO0001 Reserved for inherently not codable concepts without codable children: Secondary | ICD-10-CM | POA: Insufficient documentation

## 2011-10-15 DIAGNOSIS — Z01818 Encounter for other preprocedural examination: Secondary | ICD-10-CM | POA: Diagnosis not present

## 2011-10-15 DIAGNOSIS — M2010 Hallux valgus (acquired), unspecified foot: Secondary | ICD-10-CM | POA: Diagnosis not present

## 2011-10-15 DIAGNOSIS — M2040 Other hammer toe(s) (acquired), unspecified foot: Secondary | ICD-10-CM | POA: Diagnosis not present

## 2011-10-15 HISTORY — DX: Hypothyroidism, unspecified: E03.9

## 2011-10-15 HISTORY — DX: Irritable bowel syndrome, unspecified: K58.9

## 2011-10-15 LAB — CBC
Hemoglobin: 13.4 g/dL (ref 12.0–15.0)
Platelets: 229 10*3/uL (ref 150–400)
RBC: 3.89 MIL/uL (ref 3.87–5.11)
WBC: 5.9 10*3/uL (ref 4.0–10.5)

## 2011-10-15 LAB — BASIC METABOLIC PANEL
CO2: 29 mEq/L (ref 19–32)
Chloride: 103 mEq/L (ref 96–112)
GFR calc Af Amer: >90 mL/min (ref >90)
Potassium: 4.9 mEq/L (ref 3.5–5.1)

## 2011-10-15 LAB — SURGICAL PCR SCREEN: MRSA, PCR: NEGATIVE

## 2011-10-15 NOTE — Patient Instructions (Addendum)
Your procedure is scheduled on:  10/22/2011  Report to [facility] at 6:15     AM.  Call this number if you have problems the morning of surgery: [phone]   Do not drink or eat food:After Midnight.    Clear liquids include soda, tea, black coffee, apple or grape juice, broth.  Take these medicines the morning of surgery with A SIP OF WATER:    Do not wear jewelry, make-up or nail polish.  Do not wear lotions, powders, or perfumes. You may wear deodorant.  Do not shave 48 hours prior to surgery.  Do not bring valuables to the hospital.  Contacts, dentures or bridgework may not be worn into surgery.  Leave suitcase in the car. After surgery it may be brought to your room.  For patients admitted to the hospital, checkout time is 11:00 AM the day of discharge.   Patients discharged the day of surgery will not be allowed to drive home.  Name and phone number of your driver:   Special Instructions: CHG Shower Shower 2 days before surgery and 1 day before surgery with Hibiclens.   Please read over the following fact sheets that you were given: Pain Booklet, Surgical Site Infection Prevention, Anesthesia Post-op Instructions and Care and Recovery After Surgery    Bunionectomy A bunionectomy is surgery to remove a bunion. A bunion is an enlargement of the joint at the base of the big toe. It is made up of bone and soft tissue on the inside part of the joint. Over time, a painful lump appears on the inside of the joint. The big toe begins to point inward toward the second toe. New bone growth can occur and a bone spur may form. The pain eventually causes difficulty walking. A bunion usually results from inflammation caused by the irritation of poorly fitting shoes. It often begins later in life. A bunionectomy is performed when nonsurgical treatment no longer works. When surgery is needed, the extent of the procedure will depend on the degree of deformity of the foot. Your surgeon will discuss with you  the different procedures and what will work best for you depending on your age and health. LET YOUR CAREGIVER KNOW ABOUT:   Previous problems with anesthetics or medicines used to numb the skin.   Allergies to dyes, iodine, foods, and/or latex.   Medicines taken including herbs, eye drops, prescription medicines (especially medicines used to "thin the blood"), aspirin and other over-the-counter medicines, and steroids (by mouth or as a cream).   History of bleeding or blood problems.   Possibility of pregnancy, if this applies.   History of blood clots in your legs and/or lungs .   Previous surgery.   Other important health problems.  RISKS AND COMPLICATIONS   Infection.   Pain.   Nerve damage.   Possibility that the bunion will recur.  BEFORE THE PROCEDURE  You should be present 60 minutes prior to your procedure or as directed.  PROCEDURE  Surgery is often done so that you can go home the same day (outpatient). It may be done in a hospital or in an outpatient surgical center. An anesthetic will be used to help you sleep during the procedure. Sometimes, a spinal anesthetic is used to make you numb below the waist. A cut (incision) is made over the swollen area at the first joint of the big toe. The enlarged lump will be removed. If there is a need to reposition the bones of the big  toe, this may require more than 1 incision. The bone itself may need to be cut. Screws and wires may be used in the repair. These can be removed at a later date. In severe cases, the entire joint may need to be removed and a joint replacement inserted. When done, the incision is closed with stitches (sutures). Skin adhesive strips may be added for reinforcement. They help hold the incision closed.  AFTER THE PROCEDURE  Compression bandages (dressings) are then wrapped around the wound. This helps to keep the foot in alignment and reduce swelling. Your foot will be monitored for bleeding and swelling. You  will need to stay for a few hours in the recovery area before being discharged. This allows time for the anesthesia to wear off. You will be discharged home when you are awake, stable, and doing well. HOME CARE INSTRUCTIONS   You can expect to return to normal activities within 6 to 8 weeks after surgery. The foot is at increased risk for swelling for several months. When you can expect to bear weight on the operated foot will depend on the extent of your surgery. The milder the deformity, the less tissue is removed and the sooner the return to normal activity level. During the recovery period, a special shoe, boot, or cast may be worn to accommodate the surgical bandage and to help provide stability to the foot.   Once you are home, an ice pack applied to the operative site may help with discomfort and keep swelling down. Stop using the ice if it causes discomfort.   Keep your feet raised (elevated) when possible to lessen swelling.   If you have an elastic bandage on your foot and you have numbness, tingling, or your foot becomes cold and blue, adjust the bandage to make it comfortable.   Change dressings as directed.   Keep the wound dry and clean. The wound may be washed gently with soap and water. Gently blot dry without rubbing. Do not take baths or use swimming pools or hot tubs for 10 days, or as instructed by your caregiver.   Only take over-the-counter or prescription medicines for pain, discomfort, or fever as directed by your caregiver.   You may continue a normal diet as directed.   For activity, use crutches with no weight bearing or your orthopedic shoe as directed. Continue to use crutches or a cane as directed until you can stand without causing pain.  SEEK MEDICAL CARE IF:   You have redness, swelling, bruising, or increasing pain in the wound.   There is pus coming from the wound.   You have drainage from a wound lasting longer than 1 day.   You have an oral temperature  above 102 F (38.9 C).   You notice a bad smell coming from the wound or dressing.   The wound breaks open after sutures have been removed.   You develop dizzy episodes or fainting while standing.   You have persistent nausea or vomiting.   Your toes become cold.   Pain is not relieved with medicines.  SEEK IMMEDIATE MEDICAL CARE IF:   You develop a rash.   You have difficulty breathing.   You develop any reaction or side effects to medicines given.   Your toes are numb or blue, or you have severe pain.  MAKE SURE YOU:   Understand these instructions.   Will watch your condition.   Will get help right away if you are not doing well  or get worse.  Document Released: 05/15/2005 Document Revised: 05/21/2011 Document Reviewed: 06/20/2007 [facility] Patient Information 2012 [place], [place].

## 2011-10-15 NOTE — Progress Notes (Signed)
  Patient Details  Name: Tiffany Kim MRN: [id number] Date of Birth: 1940/11/25 Today's Date: 10/15/2011 Time:1100-1115 Charges: Gait x10'  Pt presents to therapy today with referral from MD for gait training with a walker. Pt reports that she will be in a boot after surgery. Pt educated on proper gait with walker for WBAT and NWB. Gait training also completed on 3 small steps as this is similar to the steps pt has at home. Pt able to demonstrate proper gait mechanics with walker on flat surface and stairs independently after demo from therapist. Pt states that she feels safe with walker at end of session.  Seth Bake, PTA 10/15/2011, 11:23 AM

## 2011-10-21 NOTE — H&P (Signed)
Chief Complaint / History of Present Illness: This 71 year old female returns today for a consultation to discusses the proposed surgery.  She is scheduled for bunionectomy of the right foot and hammertoe repair of the 5th digit of the right foot on 10/22/2011 at [facility].  Past Medical History:  GI Hx: (+) irritable bowel syndrome.   Endocrine Hx: (+) thyroid disorder.   Medication History: Active: Vivelle (active), Synthroid (active).   Allergies: Patient/Guardian admits allergies to codeine resulting in vomiting.   Past Surgical History: Patient/Guardian admits past surgical history of foot surgery 5th toe, cholecystectomy, hysterectomy, thyroid surgery, breast surgery.   Social History: Patient/Guardian admits alcohol use. Drinking is described as social, Patient/Guardian denies illegal drug use, Patient/Guardian denies tobacco use.   Family History: Patient/Guardian admits a family history of hypertension, heart problems associated with father, mother, cancer of colon associated with mother, cancer of breast associated with sister.   Review of Systems: No abnormal findings with the exception of the chief complaint.    Physical Exam: The patient is a pleasant, 71 year old female in no apparent distress.  She is oriented to person, place and time.  She is ambulatory.  Skin is warm, dry and supple.  All nails are well trimmed and free of pathology.  Pedal hair growth and distribution is normal.  A hyperkeratotic lesion is present along the right dorsal 5th toe.  No ulceration are present.  Dorsalis pedis and posterior tibial pulses measure 2/4.  Capillary refill time is immediate.  No pedal edema is present.  Muscle tone is normal.  Muscle strength is 5/5 with regards to dorsiflexion, plantarflexion, inversion and eversion.   There is lateral deviation of the hallux with a dorsomedial prominence of the first metatarsal head of both feet (right>left).  There is adductovarus deformity of the  right 5th toe.  Light touch sensation is grossly intact.    Test Results:  None to report at this time.    Impression:  Hallux valgus.  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  Plan:   The podiatric pathology and treatment options were again reviewed with the her.  I discussed with the her the surgical procedure itself, the indications, the risks, possible complications, post-operative course and alternative treatments. I gave no guarantees regarding the outcome. She would like to proceed with the proposed surgery.  An informed consent was obtained.  She is to return for her post-operative appointment or sooner if problems arise.  Prescriptions: Rx: Demerol- 50 mg tablet, Take 1 tablet by mouth every four to six hours as needed for pain. Max daily dose: 6.  Dispense: 42 tablet, coated. Refills: 0.  Allow Generic: Yes Rx: Phenergan- 12.5 mg tablet , Take 1 tablet by mouth every four to six hours as needed. Max daily dose: 6.  Dispense: 21 tablet. Refills: 1.  Allow Generic: Yes  cc: Primary Care Physician:  Dwana Melena, MD

## 2011-10-22 ENCOUNTER — Ambulatory Visit (HOSPITAL_COMMUNITY): Payer: Medicare Other | Admitting: Anesthesiology

## 2011-10-22 ENCOUNTER — Encounter (HOSPITAL_COMMUNITY): Payer: Self-pay | Admitting: Anesthesiology

## 2011-10-22 ENCOUNTER — Encounter (HOSPITAL_COMMUNITY)
Admission: RE | Disposition: A | Discharge status: Home / Self Care | Payer: Self-pay | Source: Ambulatory Visit | Attending: Podiatry

## 2011-10-22 ENCOUNTER — Ambulatory Visit (HOSPITAL_COMMUNITY)
Admission: RE | Admit: 2011-10-22 | Discharge: 2011-10-22 | Disposition: A | Discharge status: Home / Self Care | Payer: Medicare Other | Source: Ambulatory Visit | Attending: Podiatry | Admitting: Podiatry

## 2011-10-22 ENCOUNTER — Encounter (HOSPITAL_COMMUNITY): Payer: Self-pay | Admitting: *Deleted

## 2011-10-22 ENCOUNTER — Ambulatory Visit (HOSPITAL_COMMUNITY): Payer: Medicare Other

## 2011-10-22 DIAGNOSIS — M2040 Other hammer toe(s) (acquired), unspecified foot: Secondary | ICD-10-CM | POA: Diagnosis not present

## 2011-10-22 DIAGNOSIS — M21619 Bunion of unspecified foot: Secondary | ICD-10-CM | POA: Diagnosis not present

## 2011-10-22 DIAGNOSIS — M2010 Hallux valgus (acquired), unspecified foot: Secondary | ICD-10-CM | POA: Insufficient documentation

## 2011-10-22 DIAGNOSIS — Z4789 Encounter for other orthopedic aftercare: Secondary | ICD-10-CM | POA: Diagnosis not present

## 2011-10-22 DIAGNOSIS — Z01818 Encounter for other preprocedural examination: Secondary | ICD-10-CM | POA: Insufficient documentation

## 2011-10-22 HISTORY — PX: METATARSAL OSTEOTOMY: SHX1641

## 2011-10-22 HISTORY — PX: HAMMER TOE SURGERY: SHX385

## 2011-10-22 HISTORY — PX: BUNIONECTOMY: SHX129

## 2011-10-22 SURGERY — BUNIONECTOMY
Anesthesia: Monitor Anesthesia Care | Site: Toe | Laterality: Right | Wound class: Clean

## 2011-10-22 MED ORDER — DEXAMETHASONE SODIUM PHOSPHATE 4 MG/ML IJ SOLN
INTRAMUSCULAR | Status: AC
  Administered 2011-10-22: 4 mg via INTRAVENOUS
  Filled 2011-10-22: qty 1

## 2011-10-22 MED ORDER — DEXAMETHASONE SODIUM PHOSPHATE 4 MG/ML IJ SOLN
4.0000 mg | Freq: Once | INTRAMUSCULAR | Status: AC
  Administered 2011-10-22: 4 mg via INTRAVENOUS

## 2011-10-22 MED ORDER — BUPIVACAINE HCL (PF) 0.5 % IJ SOLN
INTRAMUSCULAR | Status: AC
  Filled 2011-10-22: qty 30

## 2011-10-22 MED ORDER — CEFAZOLIN SODIUM 1-5 GM-% IV SOLN: 1.0000 g | INTRAVENOUS | Status: DC

## 2011-10-22 MED ORDER — ONDANSETRON HCL 4 MG/2ML IJ SOLN
4.0000 mg | Freq: Once | INTRAMUSCULAR | Status: AC
  Administered 2011-10-22: 4 mg via INTRAVENOUS

## 2011-10-22 MED ORDER — MIDAZOLAM HCL 2 MG/2ML IJ SOLN
INTRAMUSCULAR | Status: AC
  Filled 2011-10-22: qty 2

## 2011-10-22 MED ORDER — CEFAZOLIN SODIUM 1-5 GM-% IV SOLN
INTRAVENOUS | Status: AC
  Filled 2011-10-22: qty 50

## 2011-10-22 MED ORDER — ONDANSETRON HCL 4 MG/2ML IJ SOLN: 4.0000 mg | Freq: Once | INTRAMUSCULAR | Status: DC | PRN

## 2011-10-22 MED ORDER — CEFAZOLIN SODIUM 1-5 GM-% IV SOLN
INTRAVENOUS | Status: DC | PRN
  Administered 2011-10-22: 1 g via INTRAVENOUS

## 2011-10-22 MED ORDER — MIDAZOLAM HCL 5 MG/5ML IJ SOLN
INTRAMUSCULAR | Status: DC | PRN
  Administered 2011-10-22: 2 mg via INTRAVENOUS

## 2011-10-22 MED ORDER — FENTANYL CITRATE 0.05 MG/ML IJ SOLN
INTRAMUSCULAR | Status: AC
  Filled 2011-10-22: qty 2

## 2011-10-22 MED ORDER — SCOPOLAMINE 1 MG/3DAYS TD PT72
1.0000 | MEDICATED_PATCH | Freq: Once | TRANSDERMAL | Status: DC
  Administered 2011-10-22: 1.5 mg via TRANSDERMAL

## 2011-10-22 MED ORDER — PROPOFOL 10 MG/ML IV EMUL
INTRAVENOUS | Status: DC | PRN
  Administered 2011-10-22: 75 ug/kg/min via INTRAVENOUS

## 2011-10-22 MED ORDER — PROPOFOL 10 MG/ML IV BOLUS
INTRAVENOUS | Status: DC | PRN
  Administered 2011-10-22: 20 mg via INTRAVENOUS

## 2011-10-22 MED ORDER — MIDAZOLAM HCL 2 MG/2ML IJ SOLN
1.0000 mg | INTRAMUSCULAR | Status: DC | PRN
  Administered 2011-10-22: 2 mg via INTRAVENOUS

## 2011-10-22 MED ORDER — FENTANYL CITRATE 0.05 MG/ML IJ SOLN: 25.0000 ug | INTRAMUSCULAR | Status: DC | PRN

## 2011-10-22 MED ORDER — MIDAZOLAM HCL 2 MG/2ML IJ SOLN
INTRAMUSCULAR | Status: AC
  Administered 2011-10-22: 2 mg via INTRAVENOUS
  Filled 2011-10-22: qty 2

## 2011-10-22 MED ORDER — SCOPOLAMINE 1 MG/3DAYS TD PT72
MEDICATED_PATCH | TRANSDERMAL | Status: AC
  Administered 2011-10-22: 1.5 mg via TRANSDERMAL
  Filled 2011-10-22: qty 1

## 2011-10-22 MED ORDER — ONDANSETRON HCL 4 MG/2ML IJ SOLN
INTRAMUSCULAR | Status: AC
  Administered 2011-10-22: 4 mg via INTRAVENOUS
  Filled 2011-10-22: qty 2

## 2011-10-22 MED ORDER — SODIUM CHLORIDE 0.9 % IR SOLN
Status: DC | PRN
Start: 2011-10-22 — End: 2011-10-22
  Administered 2011-10-22: 1000 mL

## 2011-10-22 MED ORDER — LACTATED RINGERS IV SOLN
INTRAVENOUS | Status: DC
  Administered 2011-10-22: 1000 mL via INTRAVENOUS

## 2011-10-22 MED ORDER — FENTANYL CITRATE 0.05 MG/ML IJ SOLN
INTRAMUSCULAR | Status: DC | PRN
  Administered 2011-10-22: 50 ug via INTRAVENOUS

## 2011-10-22 MED ORDER — BUPIVACAINE HCL (PF) 0.5 % IJ SOLN
INTRAMUSCULAR | Status: DC | PRN
  Administered 2011-10-22: 20 mL

## 2011-10-22 SURGICAL SUPPLY — 80 items
APL SKNCLS STERI-STRIP NONHPOA (GAUZE/BANDAGES/DRESSINGS) ×2
BAG HAMPER (MISCELLANEOUS) ×3 IMPLANT
BANDAGE ELASTIC 4 VELCRO NS (GAUZE/BANDAGES/DRESSINGS) ×3 IMPLANT
BANDAGE ESMARK 4X12 BL STRL LF (DISPOSABLE) ×2 IMPLANT
BANDAGE GAUZE ELAST BULKY 4 IN (GAUZE/BANDAGES/DRESSINGS) ×3 IMPLANT
BENZOIN TINCTURE PRP APPL 2/3 (GAUZE/BANDAGES/DRESSINGS) ×3 IMPLANT
BIT DRILL 2.0 HCS 150 (BIT) ×1 IMPLANT
BIT DRILL MICR ACTRK 2 LNG PRF (BIT) IMPLANT
BLADE AVERAGE 25X9 (BLADE) ×3 IMPLANT
BLADE OSC/SAG 11.5X5.5X.38 (BLADE) ×1 IMPLANT
BLADE OSC/SAG 18.5X9 THN (BLADE) ×2 IMPLANT
BLADE OSC/SAGITTAL MD 5.5X18 (BLADE) ×2 IMPLANT
BLADE OSC/SAGITTAL MD 9X18.5 (BLADE) IMPLANT
BLADE SURG 15 STRL LF DISP TIS (BLADE) ×4 IMPLANT
BLADE SURG 15 STRL SS (BLADE) ×6
BNDG CMPR 12X4 ELC STRL LF (DISPOSABLE) ×2
BNDG ESMARK 4X12 BLUE STRL LF (DISPOSABLE) ×3
CAP PIN PROTECTOR ORTHO WHT (CAP) IMPLANT
CHLORAPREP W/TINT 26ML (MISCELLANEOUS) ×3 IMPLANT
CLOTH BEACON ORANGE TIMEOUT ST (SAFETY) ×3 IMPLANT
COVER LIGHT HANDLE STERIS (MISCELLANEOUS) ×6 IMPLANT
CUFF TOURN SGL LL 12 (TOURNIQUET CUFF) ×3 IMPLANT
CUFF TOURNIQUET SINGLE 18IN (TOURNIQUET CUFF) IMPLANT
DECANTER SPIKE VIAL GLASS SM (MISCELLANEOUS) ×3 IMPLANT
DRAPE OEC MINIVIEW 54X84 (DRAPES) ×3 IMPLANT
DRILL MICRO ACUTRAK 2 LNG PROF (BIT) ×3
DRSG ADAPTIC 3X8 NADH LF (GAUZE/BANDAGES/DRESSINGS) ×3 IMPLANT
DURA STEPPER LG (CAST SUPPLIES) IMPLANT
DURA STEPPER MED (CAST SUPPLIES) ×1 IMPLANT
DURA STEPPER SML (CAST SUPPLIES) IMPLANT
DURA STEPPER XL (SOFTGOODS) IMPLANT
ELECT REM PT RETURN 9FT ADLT (ELECTROSURGICAL) ×3
ELECTRODE REM PT RTRN 9FT ADLT (ELECTROSURGICAL) ×2 IMPLANT
GAUZE STERILE 3X3 12 PLY (MISCELLANEOUS) ×4 IMPLANT
GLOVE BIO SURGEON STRL SZ7.5 (GLOVE) ×3 IMPLANT
GLOVE BIO SURGEON STRL SZ8 (GLOVE) ×2 IMPLANT
GLOVE BIOGEL PI IND STRL 7.0 (GLOVE) IMPLANT
GLOVE BIOGEL PI INDICATOR 7.0 (GLOVE) ×3
GLOVE ECLIPSE 6.5 STRL STRAW (GLOVE) ×1 IMPLANT
GLOVE ECLIPSE 8.0 STRL XLNG CF (GLOVE) ×2 IMPLANT
GLOVE SS BIOGEL STRL SZ 6.5 (GLOVE) IMPLANT
GLOVE SUPERSENSE BIOGEL SZ 6.5 (GLOVE) ×1
GOWN STRL REIN XL XLG (GOWN DISPOSABLE) ×9 IMPLANT
GOWN W/COTTON TOWEL STD LRG (GOWNS) ×4 IMPLANT
GUIDEWIRE ORTHO MICROSHT  ACUT (WIRE) ×1
GUIDEWIRE ORTHO MICROSHT .035 (WIRE) IMPLANT
K-WIRE 229MX1.6 (WIRE) IMPLANT
K-WIRE 6 (WIRE)
KIT ROOM TURNOVER AP CYSTO (KITS) ×3 IMPLANT
KIT ROOM TURNOVER APOR (KITS) ×3 IMPLANT
KWIRE 6 (WIRE) IMPLANT
MANIFOLD NEPTUNE II (INSTRUMENTS) ×3 IMPLANT
NDL HYPO 18GX1.5 BLUNT FILL (NEEDLE) ×2 IMPLANT
NDL HYPO 27GX1-1/4 (NEEDLE) ×8 IMPLANT
NEEDLE HYPO 18GX1.5 BLUNT FILL (NEEDLE) IMPLANT
NEEDLE HYPO 27GX1-1/4 (NEEDLE) ×9 IMPLANT
NS IRRIG 1000ML POUR BTL (IV SOLUTION) ×3 IMPLANT
PACK BASIC LIMB (CUSTOM PROCEDURE TRAY) ×3 IMPLANT
PAD ARMBOARD 7.5X6 YLW CONV (MISCELLANEOUS) ×3 IMPLANT
PAIN PUMP ON-Q 100MLX2ML 2.5IN (PAIN MANAGEMENT) IMPLANT
PIN CAPS ORTHO GREEN .062 (PIN) IMPLANT
PIN STMN 9X.157 IN (PIN) ×1 IMPLANT
RASP SM TEAR CROSS CUT (RASP) ×2 IMPLANT
SCREW ACUTRAK 2 MICRO 16MM (Screw) ×1 IMPLANT
SCREW ACUTRAK 2 MICRO 18MM (Screw) ×1 IMPLANT
SCREW ACUTRAK 2 MICRO 20MM (Screw) ×1 IMPLANT
SET BASIN LINEN APH (SET/KITS/TRAYS/PACK) ×3 IMPLANT
SPONGE GAUZE 4X4 12PLY (GAUZE/BANDAGES/DRESSINGS) ×3 IMPLANT
STRIP CLOSURE SKIN 1/2X4 (GAUZE/BANDAGES/DRESSINGS) ×5 IMPLANT
STRIP CLOSURE SKIN 1/8X3 (GAUZE/BANDAGES/DRESSINGS) ×7 IMPLANT
SUT MON AB 5-0 PS2 18 (SUTURE) ×3 IMPLANT
SUT PROLENE 4 0 PS 2 18 (SUTURE) ×1 IMPLANT
SUT VIC AB 2-0 CT2 27 (SUTURE) ×3 IMPLANT
SUT VIC AB 4-0 PS2 27 (SUTURE) ×3 IMPLANT
SUT VICRYL AB 3-0 FS1 BRD 27IN (SUTURE) ×3 IMPLANT
SYR 3ML LL SCALE MARK (SYRINGE) ×2 IMPLANT
SYR 50ML LL SCALE MARK (SYRINGE) ×2 IMPLANT
SYR BULB IRRIGATION 50ML (SYRINGE) ×3 IMPLANT
SYR CONTROL 10ML LL (SYRINGE) ×9 IMPLANT
TOWEL OR 17X26 4PK STRL BLUE (TOWEL DISPOSABLE) ×3 IMPLANT

## 2011-10-22 NOTE — Brief Op Note (Signed)
OPERATIVE REPORT  SURGEON:   Dallas Schimke, DPM  OR STAFF:   Caprice Kluver, RN - Circulator Hurshel Party, CST - Scrub Person Lennox Pippins, RN - Circulator Assistant Lizabeth Leyden, RN - RN First Assistant   PREOPERATIVE DIAGNOSIS:   1.   Hallux valgus right foot 2.   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 Austin bunionectomy right foot 2. Akin osteotomy of the proximal phalanx right foot 3. Arthroplasty 5th digit right foot  ANESTHESIA:  Monitor Anesthesia Care   HEMOSTASIS:   Pneumatic ankle tourniquet set at 250 mmHg  ESTIMATED BLOOD LOSS:   Minimal (< 5cc)  MATERIALS USED:  Acumed screw x 2  INJECTABLES: Marcaine 0.5% plain; 20mL  PATHOLOGY:   None  COMPLICATIONS:   None  INDICATIONS:  Painful bunion deformity of the right foot and painful hammertoe deformity of the right foot  DICTATION:  Office manager and Other Dictation: Dictation Number [id number]

## 2011-10-22 NOTE — Anesthesia Preprocedure Evaluation (Signed)
Anesthesia Evaluation  Patient identified by MRN, date of birth, ID band Patient awake    Reviewed: Allergy & Precautions, H&P , NPO status , Patient's Chart, lab work & pertinent test results  History of Anesthesia Complications (+) PONV  Airway Mallampati: II      Dental  (+) Teeth Intact   Pulmonary neg pulmonary ROS,  breath sounds clear to auscultation        Cardiovascular negative cardio ROS  Rhythm:Regular     Neuro/Psych    GI/Hepatic GERD-  Medicated and Controlled,  Endo/Other  Hypothyroidism   Renal/GU      Musculoskeletal   Abdominal   Peds  Hematology   Anesthesia Other Findings   Reproductive/Obstetrics                           Anesthesia Physical Anesthesia Plan  ASA: II  Anesthesia Plan: MAC   Post-op Pain Management:    Induction: Intravenous  Airway Management Planned: Nasal Cannula  Additional Equipment:   Intra-op Plan:   Post-operative Plan:   Informed Consent: I have reviewed the patients History and Physical, chart, labs and discussed the procedure including the risks, benefits and alternatives for the proposed anesthesia with the patient or authorized representative who has indicated his/her understanding and acceptance.     Plan Discussed with:   Anesthesia Plan Comments:         Anesthesia Quick Evaluation

## 2011-10-22 NOTE — Op Note (Signed)
HISTORY AND PHYSICAL INTERVAL NOTE:  10/22/2011  7:26 AM  Tiffany Kim  has presented today for surgery, with the diagnosis of hallux valgus right foot, hammertoe deformity 5th digit right foot.  The various methods of treatment have been discussed with the patient.  No guarantees were given.  After consideration of risks, benefits and other options for treatment, the patient has consented to surgery.  I have reviewed the patients' chart and labs.    Patient Vitals for the past 24 hrs:  BP Temp Temp src Pulse Resp SpO2  10/22/11 0715 146/84 mmHg - - - 18  98 %  10/22/11 0700 150/80 mmHg - - - 24  98 %  10/22/11 0645 141/82 mmHg - - - 20  99 %  10/22/11 0631 141/82 mmHg 97.8 F (36.6 C) Oral 57  17  97 %    A history and physical examination was performed in my office on 10/13/2011.  The patient was reexamined.  There have been no changes to this history and physical examination.  Dallas Schimke, DPM

## 2011-10-22 NOTE — Anesthesia Postprocedure Evaluation (Signed)
  Anesthesia Post-op Note  Patient: Tiffany Kim  Procedure(s) Performed: Procedure(s) (LRB): BUNIONECTOMY (Right) METATARSAL OSTEOTOMY (Right) HAMMER TOE CORRECTION (Right)  Patient Location: PACU  Anesthesia Type: MAC  Level of Consciousness: awake, alert  and oriented  Airway and Oxygen Therapy: Patient Spontanous Breathing  Post-op Pain: none  Post-op Assessment: Post-op Vital signs reviewed, Patient's Cardiovascular Status Stable, Respiratory Function Stable, Patent Airway and No signs of Nausea or vomiting  Post-op Vital Signs: Reviewed and stable  Complications: No apparent anesthesia complications

## 2011-10-22 NOTE — Transfer of Care (Signed)
Immediate Anesthesia Transfer of Care Note  Patient: Tiffany Kim  Procedure(s) Performed: Procedure(s) (LRB): BUNIONECTOMY (Right) METATARSAL OSTEOTOMY (Right) HAMMER TOE CORRECTION (Right)  Patient Location: PACU  Anesthesia Type: MAC  Level of Consciousness: awake, alert  and oriented  Airway & Oxygen Therapy: Patient Spontanous Breathing  Post-op Assessment: Report given to PACU RN  Post vital signs: Reviewed and stable  Complications: No apparent anesthesia complications

## 2011-10-23 NOTE — Op Note (Signed)
NAMEANGY, SWEARENGIN                ACCOUNT NO.:  [id number]  MEDICAL RECORD NO.:  [id number]  LOCATION:  [facility]                          FACILITY:  [facility]  PHYSICIAN:  B. Theola Sequin, MD   DATE OF BIRTH:  Oct 23, 1940  DATE OF PROCEDURE:  10/22/2011 DATE OF DISCHARGE:  10/22/2011                              OPERATIVE REPORT   SURGEON:  B. Theola Sequin, MD  ASSISTANT:  [place].  PREOPERATIVE DIAGNOSES: 1. Hallux valgus, right foot. 2. Hammertoe deformity, 5th digit, right foot.  POSTOPERATIVE DIAGNOSES: 1. Hallux valgus, right foot. 2. Hammertoe deformity, 5th digit, right foot.  PROCEDURE: 1. Austin bunionectomy, right foot. 2. Akin osteotomy of the proximal phalanx, right foot. 3. Arthroplasty 5th digit, right foot.  ANESTHESIA:  MAC with local.  HEMOSTASIS:  Pneumatic ankle tourniquet at 250 mmHg.  ESTIMATED BLOOD LOSS:  Minimal (less than 5 mL).  MATERIALS:  Acumed screw x2.  INJECTABLE:  0.5% Marcaine plain.  PATHOLOGY:  None.  COMPLICATIONS:  None.  PROCEDURE IN DETAIL:  The patient was brought to the operating room and placed on the operative table in the supine position.  The pneumatic ankle tourniquet was placed about the patient's right ankle.  The foot was then anesthetized using 0.5% Marcaine plain.  The foot was scrubbed, prepped, and draped in the usual sterile manner.  The limb was then elevated, exsanguinated, and the pneumatic ankle tourniquet inflated to 250 mmHg.  Attention was directed to the dorsomedial aspect of the right foot, where a 6 cm curvilinear incision was made medial and parallel to the extensor hallucis longus tendon and involved the contour of the deformity.  The incision was continued overlying the proximal phalanx and first metatarsophalangeal joint region.  Dissection was continued deep down to the level of the first metatarsophalangeal joint.  A T style periosteal capsular incision was performed.  The periosteal capsular  structures were reflected medially and laterally thus exposing the first metatarsal head and proximal phalanx.  Prominent medial eminence from the first metatarsal head was resected and passed from the operative field.  Attention was then directed to the first interspace via the original skin incision where the tendon of the adductor hallucis muscle was identified and transected as attachment to the base of the proximal .  Attention was redirected to the medial aspect of the first metatarsal head where a chevron osteotomy was performed in the first metatarsal.  The capital fragment was distracted and shifted into more corrected lateral position.  Acumed screw was inserted across the osteotomy site with adequate compression noted.  Position of the screw was confirmed under C-arm fluoroscopy.  The remaining medial bone shelf was resected using a power bone saw.  The wound was irrigated with copious amounts of sterile irrigant.  Attention was then directed to the proximal phalanx where a wedge-shaped osteotomy was performed with the base oriented distally and medially and the apex proximally and laterally.  The wedge of bone was removed.  The hinge was feathered to allow for adequate closure of the osteotomy.  An Acumed screw was inserted across the osteotomy site.  At first a screw measuring 16 mm in length was utilized, however,  failure to provide adequate compression was noted.  The screw was removed and replaced with an 18 mm screw, which provided more optimal compression.  Position of the screw was confirmed with C-arm fluoroscopy and noted to be in appropriate length and in appropriate alignment.  The wound was again irrigated with copious amounts of sterile irrigant.  The periosteal and capsular structures were reapproximated using 3-0 Vicryl.  A medial capsulorrhaphy was then performed at the level of the first metatarsophalangeal joint using 2-0 Vicryl.  The capsular structures were  reapproximated using 4-0 Vicryl and the skin reapproximated using 5- 0 Monocryl in a running subcuticular manner.  Incision was reinforced with Steri-Strips.  Attention was then directed to the 5th digit of the right foot where 2 converging semielliptical incisions were made overlying the level of the proximal interphalangeal joint.  The incision was made from a proximal lateral to a medial distal direction.  The wedge of skin was removed and passed from the operative field.  A transverse tenotomy and capsulotomy was then performed at the level of the proximal interphalangeal joint. The head of the proximal phalanx was identified and resected using a power bone saw.  The extensor tendon was reapproximated using 4-0 Vicryl.  The wound was irrigated with copious amounts of sterile irrigant.  The skin was reapproximated using 4-0 Prolene in a horizontal mattress and simple suture technique.  Sterile compressive dressing was then applied to the operative foot.  The pneumatic ankle tourniquet was deflated and a prompt hyperemic response was noted to all digits of the operative foot.          ______________________________ B. Theola Sequin, MD     BIM/MEDQ  D:  10/22/2011  T:  10/23/2011  Job:  [id number]

## 2011-10-26 ENCOUNTER — Encounter (HOSPITAL_COMMUNITY): Payer: Self-pay | Admitting: Podiatry

## 2011-11-24 DIAGNOSIS — M2010 Hallux valgus (acquired), unspecified foot: Secondary | ICD-10-CM | POA: Diagnosis not present

## 2011-12-01 DIAGNOSIS — M2010 Hallux valgus (acquired), unspecified foot: Secondary | ICD-10-CM | POA: Diagnosis not present

## 2011-12-08 DIAGNOSIS — M2010 Hallux valgus (acquired), unspecified foot: Secondary | ICD-10-CM | POA: Diagnosis not present

## 2011-12-22 DIAGNOSIS — M2010 Hallux valgus (acquired), unspecified foot: Secondary | ICD-10-CM | POA: Diagnosis not present

## 2011-12-29 DIAGNOSIS — H2510 Age-related nuclear cataract, unspecified eye: Secondary | ICD-10-CM | POA: Diagnosis not present

## 2012-01-12 DIAGNOSIS — M2010 Hallux valgus (acquired), unspecified foot: Secondary | ICD-10-CM | POA: Diagnosis not present

## 2012-01-25 DIAGNOSIS — H52219 Irregular astigmatism, unspecified eye: Secondary | ICD-10-CM | POA: Diagnosis not present

## 2012-01-25 DIAGNOSIS — H2510 Age-related nuclear cataract, unspecified eye: Secondary | ICD-10-CM | POA: Diagnosis not present

## 2012-01-25 DIAGNOSIS — H269 Unspecified cataract: Secondary | ICD-10-CM | POA: Diagnosis not present

## 2012-02-05 DIAGNOSIS — H2510 Age-related nuclear cataract, unspecified eye: Secondary | ICD-10-CM | POA: Diagnosis not present

## 2012-02-18 DIAGNOSIS — N8111 Cystocele, midline: Secondary | ICD-10-CM | POA: Diagnosis not present

## 2012-02-18 DIAGNOSIS — N390 Urinary tract infection, site not specified: Secondary | ICD-10-CM | POA: Diagnosis not present

## 2012-02-18 DIAGNOSIS — R350 Frequency of micturition: Secondary | ICD-10-CM | POA: Diagnosis not present

## 2012-03-07 DIAGNOSIS — H2510 Age-related nuclear cataract, unspecified eye: Secondary | ICD-10-CM | POA: Diagnosis not present

## 2012-03-07 DIAGNOSIS — H269 Unspecified cataract: Secondary | ICD-10-CM | POA: Diagnosis not present

## 2012-08-31 DIAGNOSIS — M25539 Pain in unspecified wrist: Secondary | ICD-10-CM | POA: Diagnosis not present

## 2012-09-07 DIAGNOSIS — M25539 Pain in unspecified wrist: Secondary | ICD-10-CM | POA: Diagnosis not present

## 2012-10-20 ENCOUNTER — Other Ambulatory Visit: Payer: Self-pay | Admitting: Adult Health

## 2012-12-22 ENCOUNTER — Telehealth: Payer: Self-pay | Admitting: Adult Health

## 2012-12-22 MED ORDER — NITROFURANTOIN MONOHYD MACRO 100 MG PO CAPS: 100.0000 mg | ORAL_CAPSULE | Freq: Two times a day (BID) | ORAL | Status: DC

## 2012-12-22 NOTE — Telephone Encounter (Signed)
Complains of UTI symptoms, leaving for [facility] wants meds, Rx Macrobid 1 bid x 7 days to [address]

## 2012-12-22 NOTE — Telephone Encounter (Signed)
Spoke with pt. Thinks she has a bladder infection. Feeling urgency and pain with urination. Had to get up during the night to use the bathroom and she never has to do that. Getting ready to go out of town tomorrow am. Can you call in meds? Uses [facility] . Thanks!!!!

## 2013-02-25 ENCOUNTER — Other Ambulatory Visit: Payer: Self-pay | Admitting: Adult Health

## 2013-03-27 ENCOUNTER — Other Ambulatory Visit: Payer: Self-pay | Admitting: Adult Health

## 2013-03-27 DIAGNOSIS — Z139 Encounter for screening, unspecified: Secondary | ICD-10-CM

## 2013-03-28 ENCOUNTER — Other Ambulatory Visit: Payer: Self-pay | Admitting: Adult Health

## 2013-03-28 ENCOUNTER — Ambulatory Visit (HOSPITAL_COMMUNITY)
Admission: RE | Admit: 2013-03-28 | Discharge: 2013-03-28 | Disposition: A | Discharge status: Home / Self Care | Payer: Medicare Other | Source: Ambulatory Visit | Attending: Adult Health | Admitting: Adult Health

## 2013-03-28 DIAGNOSIS — Z1231 Encounter for screening mammogram for malignant neoplasm of breast: Secondary | ICD-10-CM | POA: Diagnosis not present

## 2013-03-28 DIAGNOSIS — Z139 Encounter for screening, unspecified: Secondary | ICD-10-CM

## 2013-03-29 DIAGNOSIS — Z0000 Encounter for general adult medical examination without abnormal findings: Secondary | ICD-10-CM | POA: Diagnosis not present

## 2013-03-29 DIAGNOSIS — E039 Hypothyroidism, unspecified: Secondary | ICD-10-CM | POA: Diagnosis not present

## 2013-03-29 DIAGNOSIS — I10 Essential (primary) hypertension: Secondary | ICD-10-CM | POA: Diagnosis not present

## 2013-03-31 DIAGNOSIS — E039 Hypothyroidism, unspecified: Secondary | ICD-10-CM | POA: Diagnosis not present

## 2013-03-31 DIAGNOSIS — R252 Cramp and spasm: Secondary | ICD-10-CM | POA: Diagnosis not present

## 2013-03-31 DIAGNOSIS — IMO0001 Reserved for inherently not codable concepts without codable children: Secondary | ICD-10-CM | POA: Diagnosis not present

## 2013-04-21 DIAGNOSIS — M25539 Pain in unspecified wrist: Secondary | ICD-10-CM | POA: Diagnosis not present

## 2013-04-25 ENCOUNTER — Other Ambulatory Visit (HOSPITAL_COMMUNITY): Payer: Self-pay | Admitting: Orthopaedic Surgery

## 2013-04-25 DIAGNOSIS — M25531 Pain in right wrist: Secondary | ICD-10-CM

## 2013-04-27 ENCOUNTER — Encounter (HOSPITAL_COMMUNITY): Payer: Self-pay

## 2013-04-27 ENCOUNTER — Ambulatory Visit (HOSPITAL_COMMUNITY)
Admission: RE | Admit: 2013-04-27 | Discharge: 2013-04-27 | Disposition: A | Discharge status: Home / Self Care | Payer: Medicare Other | Source: Ambulatory Visit | Attending: Orthopaedic Surgery | Admitting: Orthopaedic Surgery

## 2013-04-27 DIAGNOSIS — M25539 Pain in unspecified wrist: Secondary | ICD-10-CM | POA: Insufficient documentation

## 2013-04-27 DIAGNOSIS — M6740 Ganglion, unspecified site: Secondary | ICD-10-CM | POA: Insufficient documentation

## 2013-04-27 DIAGNOSIS — M25531 Pain in right wrist: Secondary | ICD-10-CM

## 2013-05-01 DIAGNOSIS — M25539 Pain in unspecified wrist: Secondary | ICD-10-CM | POA: Diagnosis not present

## 2013-05-19 DIAGNOSIS — M24139 Other articular cartilage disorders, unspecified wrist: Secondary | ICD-10-CM | POA: Diagnosis not present

## 2013-05-19 DIAGNOSIS — M19049 Primary osteoarthritis, unspecified hand: Secondary | ICD-10-CM | POA: Diagnosis not present

## 2013-05-22 ENCOUNTER — Other Ambulatory Visit: Payer: Self-pay | Admitting: Adult Health

## 2013-06-08 IMAGING — CR DG FOOT COMPLETE 3+V*R*
3 series · 3 of 3 positions shown · non-contrast
Comparison: None

CLINICAL DATA: Postop [REDACTED] bunionectomy and Elva osteotomy with
fifth toe PIP joint arthroplasty

RIGHT FOOT COMPLETE - 3+ VIEW

[view not recorded (1 of 3)]
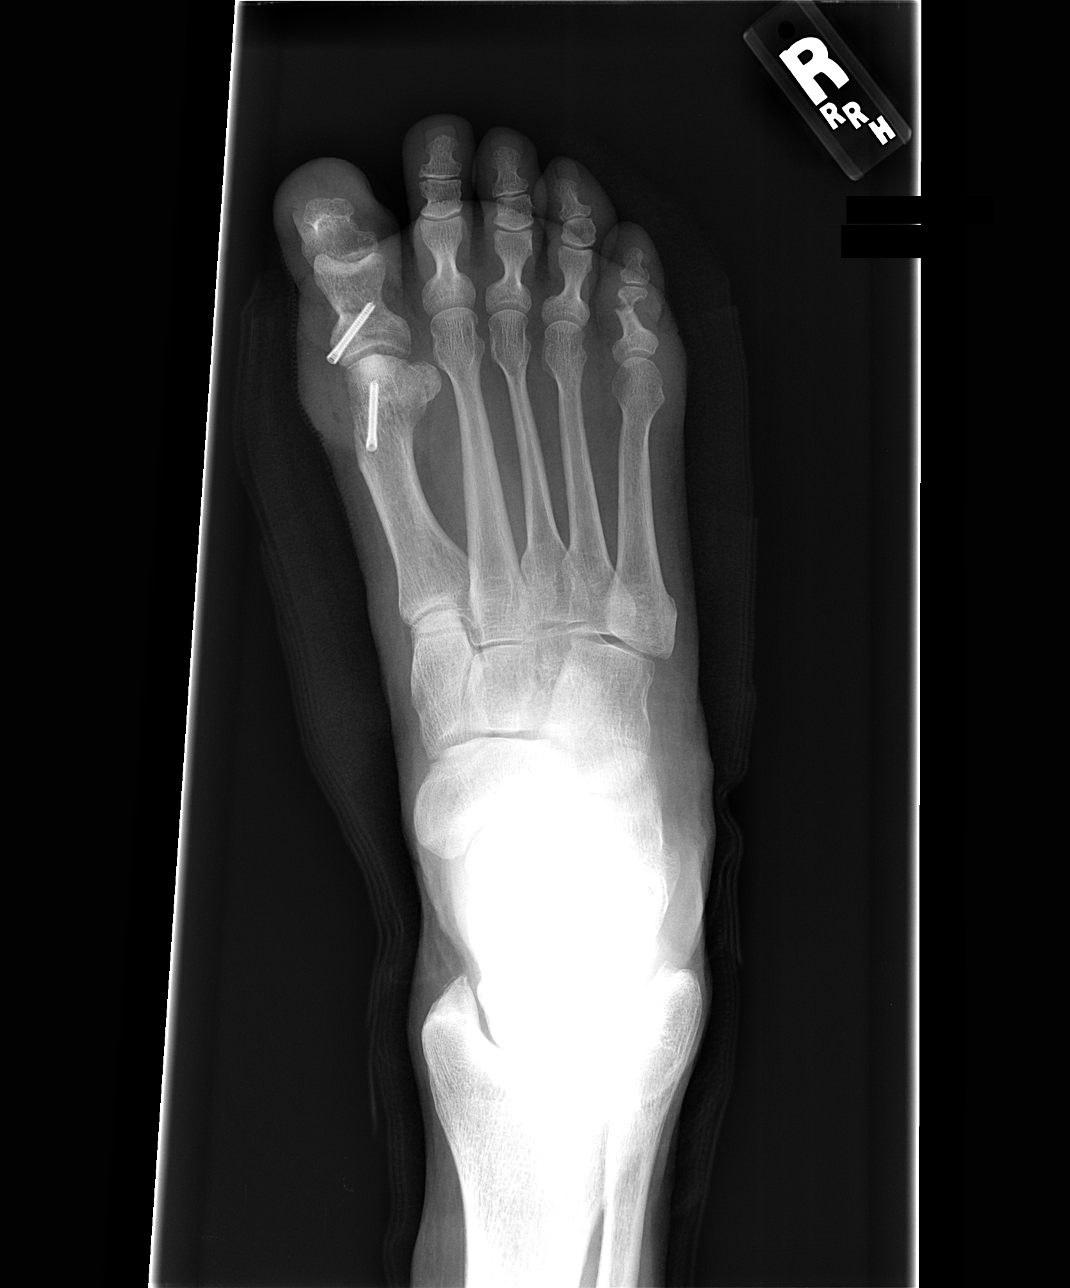

[view not recorded (2 of 3)]
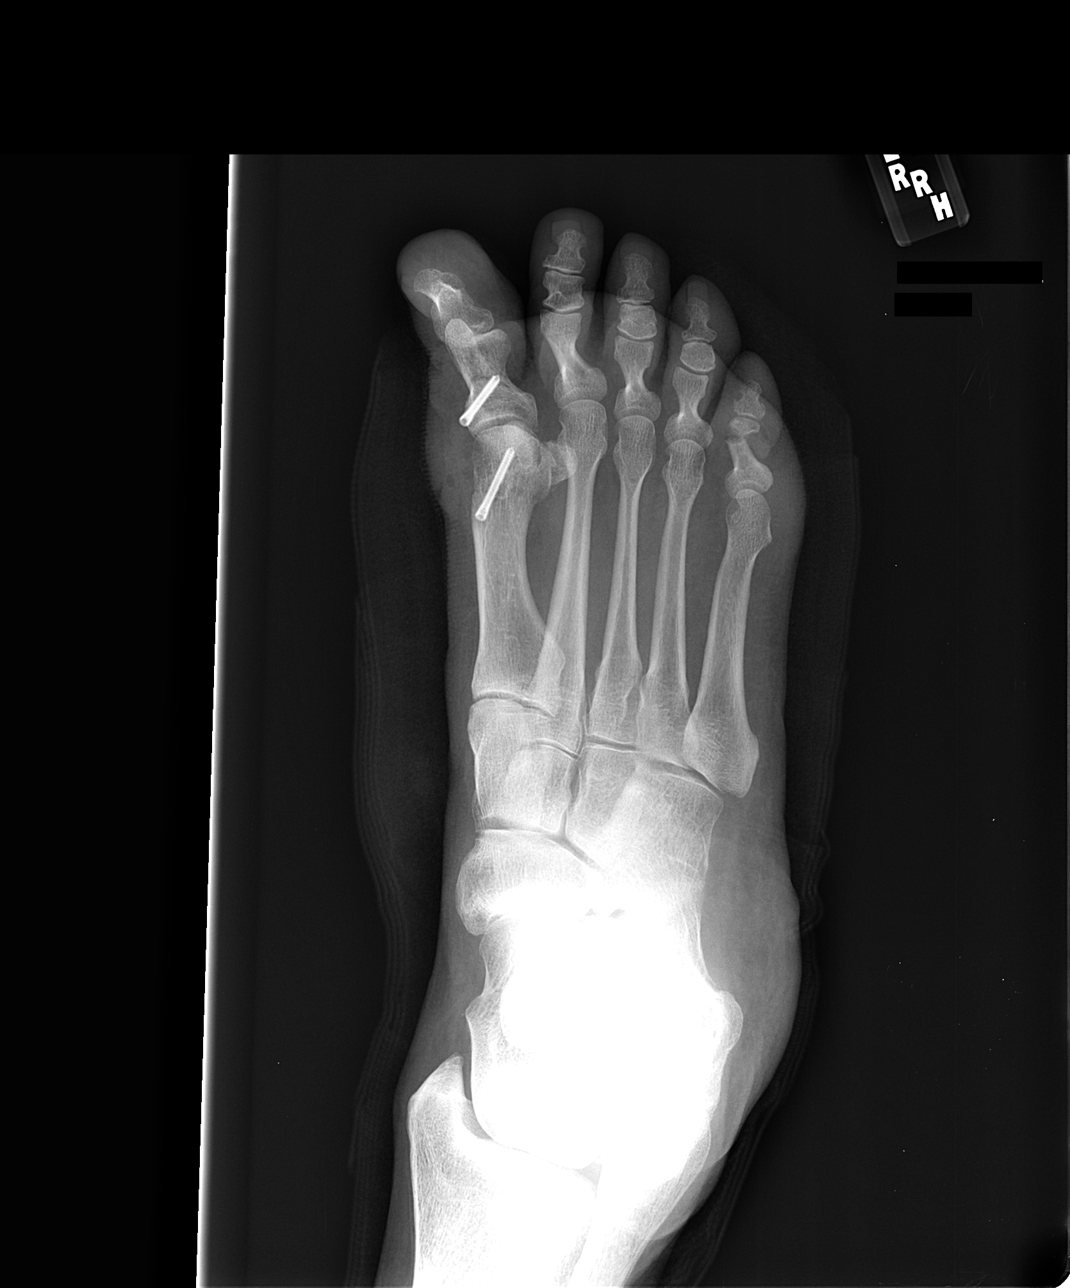

[view not recorded (3 of 3)]
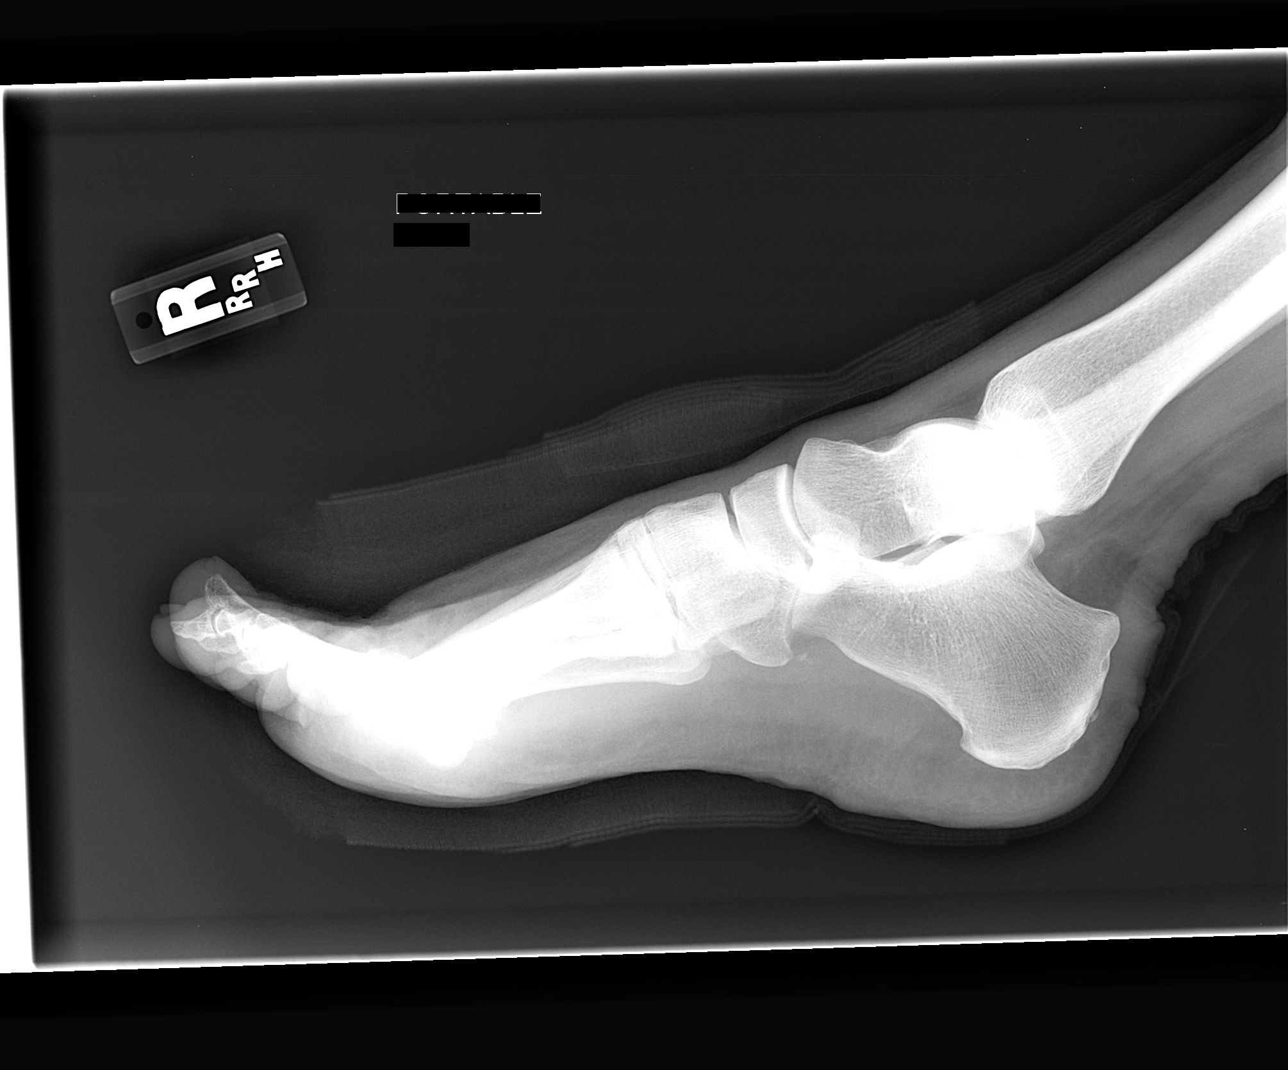

[3 of 3 positions shown; findings below may reference images not displayed]

FINDINGS: Dressing artifacts.
Post resection of the medial margin of the distal first metacarpal
with osteotomy and single cannulated screw.
Ejection osteotomy of the proximal phalanx great toe with single
cannulated screw.
Post resection of the PIP joint fifth toe.
Osseous mineralization normal.
No additional fracture, dislocation or bone destruction.
IMPRESSION: Postoperative changes of the great toe and fifth toe as above.

## 2013-07-14 DIAGNOSIS — M19049 Primary osteoarthritis, unspecified hand: Secondary | ICD-10-CM | POA: Diagnosis not present

## 2013-07-14 DIAGNOSIS — M24139 Other articular cartilage disorders, unspecified wrist: Secondary | ICD-10-CM | POA: Diagnosis not present

## 2013-07-19 DIAGNOSIS — E039 Hypothyroidism, unspecified: Secondary | ICD-10-CM | POA: Diagnosis not present

## 2013-07-19 DIAGNOSIS — R079 Chest pain, unspecified: Secondary | ICD-10-CM | POA: Diagnosis not present

## 2013-07-19 DIAGNOSIS — R0789 Other chest pain: Secondary | ICD-10-CM | POA: Diagnosis not present

## 2013-08-25 DIAGNOSIS — R0789 Other chest pain: Secondary | ICD-10-CM

## 2013-08-25 DIAGNOSIS — R072 Precordial pain: Secondary | ICD-10-CM | POA: Insufficient documentation

## 2013-08-25 DIAGNOSIS — R252 Cramp and spasm: Secondary | ICD-10-CM | POA: Insufficient documentation

## 2013-08-25 DIAGNOSIS — IMO0001 Reserved for inherently not codable concepts without codable children: Secondary | ICD-10-CM | POA: Insufficient documentation

## 2013-08-28 ENCOUNTER — Encounter: Payer: Self-pay | Admitting: Cardiology

## 2013-08-28 ENCOUNTER — Encounter (INDEPENDENT_AMBULATORY_CARE_PROVIDER_SITE_OTHER): Payer: Self-pay

## 2013-08-28 ENCOUNTER — Ambulatory Visit (INDEPENDENT_AMBULATORY_CARE_PROVIDER_SITE_OTHER): Payer: Medicare Other | Admitting: Cardiology

## 2013-08-28 VITALS — BP 159/73 | HR 97 | Ht 63.0 in | Wt 130.0 lb

## 2013-08-28 DIAGNOSIS — R0789 Other chest pain: Secondary | ICD-10-CM

## 2013-08-28 DIAGNOSIS — E039 Hypothyroidism, unspecified: Secondary | ICD-10-CM

## 2013-08-28 DIAGNOSIS — R072 Precordial pain: Secondary | ICD-10-CM | POA: Diagnosis not present

## 2013-08-28 DIAGNOSIS — R030 Elevated blood-pressure reading, without diagnosis of hypertension: Secondary | ICD-10-CM

## 2013-08-28 DIAGNOSIS — IMO0001 Reserved for inherently not codable concepts without codable children: Secondary | ICD-10-CM

## 2013-08-28 NOTE — Progress Notes (Signed)
Clinical Summary Ms. Coronado is a 73 y.o.female referred for cardiology consultation by Dr. Nevada Crane. She reports recent episodes of left jaw and chest discomfort, somewhat vague, onset at rest without any other precipitant. She was watching TV on one occasion. She has had no progression, and specifically denies any exertional chest discomfort or breathlessness, even while doing activities such as doubles tennis.  She had cardiology workup in the past with Dr. Claiborne Billings, reports undergoing a Myoview several years ago that was negative for ischemia. Also aware of a heart murmur over time, prior echocardiogram reportedly without any major valvular abnormalities.  Lab work from October 2014 showed cholesterol 192, triglycerides 91, HDL 58, LDL 116, potassium 4.7, BUN 17, creatinine 0.6, normal LFTs, hemoglobin 14.2, platelets 274, TSH 1.0.  ECG today shows normal sinus rhythm. Previous ECG from May 2013 showed sinus bradycardia with poor R-wave progression anteriorly.  Allergies  Allergen Reactions  . Codeine Nausea And Vomiting    Current Outpatient Prescriptions  Medication Sig Dispense Refill  . Biotin 5000 MCG CAPS Take 5,000 mcg by mouth daily.      . Cholecalciferol (D3-1000 PO) Take 1 capsule by mouth daily.      Marland Kitchen estradiol (CLIMARA - DOSED IN MG/24 HR) 0.05 mg/24hr patch APPLY ONE PATCH WEEKLY.  4 patch  6  . hydrochlorothiazide (HYDRODIURIL) 25 MG tablet TAKE 1/2 TO 1 TABLET DAILY AS NEEDED FOR FLUID.  30 tablet  6  . multivitamin (THERAGRAN) per tablet Take 1 tablet by mouth daily.      . multivitamin-lutein (OCUVITE-LUTEIN) CAPS capsule Take 1 capsule by mouth daily.      . naproxen (NAPROSYN) 500 MG tablet Take 500 mg by mouth 2 (two) times daily with a meal.      . Omega-3 Fatty Acids (FISH OIL) 1000 MG CAPS Take 1,000 mg by mouth daily.      Marland Kitchen SYNTHROID 100 MCG tablet TAKE (1) TABLET BY MOUTH DAILY.  90 tablet  3   No current facility-administered medications for this visit.     Past Medical History  Diagnosis Date  . IBS (irritable bowel syndrome)   . Hypothyroidism     Past Surgical History  Procedure Laterality Date  . Abdominal hysterectomy    . Cholecystectomy    . Foot surgery    . Breast biopsy    . Thyroidectomy    . Bunionectomy  10/22/2011    Procedure: Lillard Anes;  Surgeon: Marcheta Grammes, DPM;  Location: [facility];  Service: Orthopedics;  Laterality: Right;  [place] Bunionectomy Right Foot  . Metatarsal osteotomy  10/22/2011    Procedure: METATARSAL OSTEOTOMY;  Surgeon: Marcheta Grammes, DPM;  Location: [facility];  Service: Orthopedics;  Laterality: Right;  Possible [place] Osteotomy Right Foot  . Hammer toe surgery  10/22/2011    Procedure: HAMMER TOE CORRECTION;  Surgeon: Marcheta Grammes, DPM;  Location: [facility];  Service: Orthopedics;  Laterality: Right;  Arthroplasty 5th Toe Right Foot    Family History  Problem Relation Age of Onset  . Heart attack Father     Age 67  . Heart attack Mother     Late 57s  . COPD Sister   . Hypertension Brother   . Breast cancer Sister     Social History Ms. Thelen reports that she has never smoked. She does not have any smokeless tobacco history on file. Ms. Garza reports that she drinks about 1.2 ounces of alcohol per week.  Review of Systems Somewhat  dizzy at times, no palpitations or syncope. No fevers or chills. Has had trouble with tendinitis in her right wrist, also tender left hamstring. Otherwise negative.  Physical Examination Filed Vitals:   08/28/13 0917  BP: 159/73  Pulse: 97   Filed Weights   08/28/13 0917  Weight: 130 lb (58.968 kg)   Normally nourished appearing woman, comfortable at rest. HEENT: Conjunctiva and lids normal, oropharynx clear with moist mucosa. Neck: Supple, no elevated JVP or carotid bruits, no thyromegaly. Lungs: Clear to auscultation, nonlabored breathing at rest. Cardiac: Regular rate and rhythm, no S3 2/6 systolic murmur at base, no pericardial  rub. Abdomen: Soft, nontender, bowel sounds present, no guarding or rebound. Extremities: No pitting edema, distal pulses 2+. Soft brace on right wrist. Skin: Warm and dry. Musculoskeletal: No kyphosis. Neuropsychiatric: Alert and oriented x3, affect grossly appropriate.   Problem List and Plan   Precordial pain Fairly atypical in description, also at one point had left-sided jaw discomfort. This is nonexertional, otherwise she is active, plays doubles tennis without limitation. ECG is normal today. She does have family history of premature CAD in her father, blood pressure is noted to be elevated today. She has not had recent followup ischemic evaluation. We will arrange a Lexiscan Cardiolite for further evaluation, avoiding the treadmill with her left-sided hamstring tenderness.  HYPOTHYROIDISM Followed by Dr. Nevada Crane, on Synthroid.  Elevated blood pressure Noted today. No standing history of hypertension. She did report some feeling of occasional dizziness, orthostatics were negative today. Would keep followup with Dr. Nevada Crane.    Satira Sark, M.D., F.A.C.C.

## 2013-08-28 NOTE — Assessment & Plan Note (Signed)
Fairly atypical in description, also at one point had left-sided jaw discomfort. This is nonexertional, otherwise she is active, plays doubles tennis without limitation. ECG is normal today. She does have family history of premature CAD in her father, blood pressure is noted to be elevated today. She has not had recent followup ischemic evaluation. We will arrange a Lexiscan Cardiolite for further evaluation, avoiding the treadmill with her left-sided hamstring tenderness.

## 2013-08-28 NOTE — Assessment & Plan Note (Signed)
Followed by Dr. Nevada Crane, on Synthroid.

## 2013-08-28 NOTE — Assessment & Plan Note (Signed)
Noted today. No standing history of hypertension. She did report some feeling of occasional dizziness, orthostatics were negative today. Would keep followup with Dr. Nevada Crane.

## 2013-08-28 NOTE — Patient Instructions (Signed)
Your physician recommends that you schedule a follow-up appointment to be determined.We will call you results    Your physician recommends that you continue on your current medications as directed. Please refer to the Current Medication list given to you today.   Your physician has requested that you have a lexiscan myoview. For further information please visit [email or web address]. Please follow instruction sheet, as given.    Thank you for choosing [facility] !

## 2013-08-29 ENCOUNTER — Encounter: Payer: Self-pay | Admitting: Cardiology

## 2013-09-01 ENCOUNTER — Encounter (HOSPITAL_COMMUNITY): Payer: Self-pay

## 2013-09-01 ENCOUNTER — Encounter (HOSPITAL_COMMUNITY)
Admission: RE | Admit: 2013-09-01 | Discharge: 2013-09-01 | Disposition: A | Discharge status: Home / Self Care | Payer: Medicare Other | Source: Ambulatory Visit | Attending: Cardiology | Admitting: Cardiology

## 2013-09-01 ENCOUNTER — Encounter (HOSPITAL_COMMUNITY): Payer: Medicare Other

## 2013-09-01 ENCOUNTER — Inpatient Hospital Stay (HOSPITAL_COMMUNITY): Admission: RE | Admit: 2013-09-01 | Payer: Medicare Other | Source: Ambulatory Visit

## 2013-09-01 DIAGNOSIS — R0789 Other chest pain: Secondary | ICD-10-CM | POA: Diagnosis not present

## 2013-09-01 DIAGNOSIS — E039 Hypothyroidism, unspecified: Secondary | ICD-10-CM | POA: Diagnosis not present

## 2013-09-01 DIAGNOSIS — I10 Essential (primary) hypertension: Secondary | ICD-10-CM | POA: Diagnosis not present

## 2013-09-01 DIAGNOSIS — R072 Precordial pain: Secondary | ICD-10-CM

## 2013-09-01 DIAGNOSIS — R079 Chest pain, unspecified: Secondary | ICD-10-CM | POA: Diagnosis not present

## 2013-09-01 MED ORDER — TECHNETIUM TC 99M SESTAMIBI GENERIC - CARDIOLITE
30.0000 | Freq: Once | INTRAVENOUS | Status: AC | PRN
  Administered 2013-09-01: 30 via INTRAVENOUS

## 2013-09-01 MED ORDER — TECHNETIUM TC 99M SESTAMIBI - CARDIOLITE
10.0000 | Freq: Once | INTRAVENOUS | Status: AC | PRN
  Administered 2013-09-01: 10 via INTRAVENOUS

## 2013-09-01 MED ORDER — REGADENOSON 0.4 MG/5ML IV SOLN
INTRAVENOUS | Status: AC
  Administered 2013-09-01: 0.4 mg via INTRAVENOUS
  Filled 2013-09-01: qty 5

## 2013-09-01 MED ORDER — SODIUM CHLORIDE 0.9 % IJ SOLN
INTRAMUSCULAR | Status: AC
  Administered 2013-09-01: 10 mL via INTRAVENOUS
  Filled 2013-09-01: qty 10

## 2013-09-01 NOTE — Progress Notes (Signed)
Stress Lab Nurses Notes - [place] 09/01/2013 Reason for doing test: Precoridal chest pain Type of test: Wille Glaser Nurse performing test: Gerrit Halls, RN Nuclear Medicine Tech: Redmond Baseman Echo Tech: Not Applicable MD performing test: S. McDowell/K.Lawrence NP Family MD: Nevada Crane Test explained and consent signed: yes IV started: 22g jelco, Saline lock flushed, No redness or edema and Saline lock started in radiology Symptoms: chest tightness & heacdach Treatment/Intervention: None Reason test stopped: protocol completed After recovery IV was: Discontinued via X-ray tech and No redness or edema Patient to return to [facility]. Med at : 9:15 Patient discharged: Home Patient's Condition upon discharge was: stable Comments: During 138/73 & HR 93.  Recovery BP 133/73 & HR 78.  Symptoms resolved in recovery. Geanie Cooley T

## 2013-09-11 DIAGNOSIS — J069 Acute upper respiratory infection, unspecified: Secondary | ICD-10-CM | POA: Diagnosis not present

## 2013-10-17 DIAGNOSIS — L259 Unspecified contact dermatitis, unspecified cause: Secondary | ICD-10-CM | POA: Diagnosis not present

## 2013-10-17 DIAGNOSIS — D235 Other benign neoplasm of skin of trunk: Secondary | ICD-10-CM | POA: Diagnosis not present

## 2013-10-17 DIAGNOSIS — L570 Actinic keratosis: Secondary | ICD-10-CM | POA: Diagnosis not present

## 2013-10-19 ENCOUNTER — Other Ambulatory Visit: Payer: Self-pay | Admitting: Adult Health

## 2013-11-15 ENCOUNTER — Other Ambulatory Visit: Payer: Self-pay | Admitting: Adult Health

## 2013-11-30 DIAGNOSIS — M19049 Primary osteoarthritis, unspecified hand: Secondary | ICD-10-CM | POA: Diagnosis not present

## 2013-11-30 DIAGNOSIS — M24139 Other articular cartilage disorders, unspecified wrist: Secondary | ICD-10-CM | POA: Diagnosis not present

## 2013-12-22 ENCOUNTER — Encounter (HOSPITAL_COMMUNITY): Payer: Self-pay | Admitting: *Deleted

## 2013-12-22 ENCOUNTER — Encounter (HOSPITAL_COMMUNITY): Payer: Self-pay

## 2013-12-22 ENCOUNTER — Ambulatory Visit (HOSPITAL_COMMUNITY)
Admission: RE | Admit: 2013-12-22 | Discharge: 2013-12-22 | Disposition: A | Discharge status: Home / Self Care | Payer: Medicare Other | Source: Ambulatory Visit | Attending: Internal Medicine | Admitting: Internal Medicine

## 2013-12-22 ENCOUNTER — Ambulatory Visit (HOSPITAL_COMMUNITY)
Admission: AD | Admit: 2013-12-22 | Discharge: 2013-12-22 | Disposition: A | Discharge status: Home / Self Care | Payer: Medicare Other | Source: Ambulatory Visit | Attending: General Surgery | Admitting: General Surgery

## 2013-12-22 ENCOUNTER — Other Ambulatory Visit (HOSPITAL_COMMUNITY): Payer: Self-pay | Admitting: Internal Medicine

## 2013-12-22 ENCOUNTER — Encounter (HOSPITAL_COMMUNITY): Payer: Medicare Other | Admitting: Anesthesiology

## 2013-12-22 ENCOUNTER — Ambulatory Visit (HOSPITAL_COMMUNITY): Payer: Medicare Other | Admitting: Anesthesiology

## 2013-12-22 ENCOUNTER — Encounter (HOSPITAL_COMMUNITY)
Admission: AD | Disposition: A | Discharge status: Home / Self Care | Payer: Self-pay | Source: Ambulatory Visit | Attending: General Surgery

## 2013-12-22 DIAGNOSIS — E039 Hypothyroidism, unspecified: Secondary | ICD-10-CM | POA: Insufficient documentation

## 2013-12-22 DIAGNOSIS — K3580 Unspecified acute appendicitis: Secondary | ICD-10-CM | POA: Insufficient documentation

## 2013-12-22 DIAGNOSIS — R109 Unspecified abdominal pain: Secondary | ICD-10-CM | POA: Diagnosis not present

## 2013-12-22 DIAGNOSIS — R1031 Right lower quadrant pain: Secondary | ICD-10-CM

## 2013-12-22 DIAGNOSIS — Z79899 Other long term (current) drug therapy: Secondary | ICD-10-CM | POA: Insufficient documentation

## 2013-12-22 DIAGNOSIS — K219 Gastro-esophageal reflux disease without esophagitis: Secondary | ICD-10-CM | POA: Diagnosis not present

## 2013-12-22 HISTORY — PX: LAPAROSCOPIC APPENDECTOMY: SHX408

## 2013-12-22 SURGERY — APPENDECTOMY, LAPAROSCOPIC
Anesthesia: General | Site: Abdomen

## 2013-12-22 MED ORDER — LIDOCAINE HCL (CARDIAC) 10 MG/ML IV SOLN
INTRAVENOUS | Status: DC | PRN
  Administered 2013-12-22: 10 mg via INTRAVENOUS

## 2013-12-22 MED ORDER — AMOXICILLIN-POT CLAVULANATE 875-125 MG PO TABS: 1.0000 | ORAL_TABLET | Freq: Two times a day (BID) | ORAL | Status: DC

## 2013-12-22 MED ORDER — PROPOFOL 10 MG/ML IV BOLUS
INTRAVENOUS | Status: DC | PRN
  Administered 2013-12-22: 30 mg via INTRAVENOUS
  Administered 2013-12-22: 120 mg via INTRAVENOUS

## 2013-12-22 MED ORDER — SODIUM CHLORIDE 0.9 % IR SOLN
Status: DC | PRN
  Administered 2013-12-22: 3000 mL
  Administered 2013-12-22: 1000 mL

## 2013-12-22 MED ORDER — LACTATED RINGERS IV SOLN
INTRAVENOUS | Status: DC | PRN
  Administered 2013-12-22: 16:00:00 via INTRAVENOUS

## 2013-12-22 MED ORDER — HYDROCODONE-ACETAMINOPHEN 5-325 MG PO TABS: 1.0000 | ORAL_TABLET | Freq: Four times a day (QID) | ORAL | Status: DC | PRN

## 2013-12-22 MED ORDER — KETOROLAC TROMETHAMINE 30 MG/ML IJ SOLN
30.0000 mg | Freq: Once | INTRAMUSCULAR | Status: AC
  Administered 2013-12-22: 30 mg via INTRAVENOUS

## 2013-12-22 MED ORDER — ROCURONIUM BROMIDE 100 MG/10ML IV SOLN
INTRAVENOUS | Status: DC | PRN
  Administered 2013-12-22: 5 mg via INTRAVENOUS
  Administered 2013-12-22: 20 mg via INTRAVENOUS

## 2013-12-22 MED ORDER — MIDAZOLAM HCL 2 MG/2ML IJ SOLN
INTRAMUSCULAR | Status: AC
Start: 2013-12-22 — End: 2013-12-22
  Filled 2013-12-22: qty 2

## 2013-12-22 MED ORDER — NEOSTIGMINE METHYLSULFATE 10 MG/10ML IV SOLN
INTRAVENOUS | Status: DC | PRN
  Administered 2013-12-22: 2 mg via INTRAVENOUS

## 2013-12-22 MED ORDER — ROCURONIUM BROMIDE 50 MG/5ML IV SOLN
INTRAVENOUS | Status: AC
  Filled 2013-12-22: qty 1

## 2013-12-22 MED ORDER — IOHEXOL 300 MG/ML  SOLN
100.0000 mL | Freq: Once | INTRAMUSCULAR | Status: AC | PRN
  Administered 2013-12-22: 100 mL via INTRAVENOUS

## 2013-12-22 MED ORDER — GLYCOPYRROLATE 0.2 MG/ML IJ SOLN
INTRAMUSCULAR | Status: DC | PRN
  Administered 2013-12-22: 0.4 mg via INTRAVENOUS

## 2013-12-22 MED ORDER — SUCCINYLCHOLINE CHLORIDE 20 MG/ML IJ SOLN
INTRAMUSCULAR | Status: AC
  Filled 2013-12-22: qty 1

## 2013-12-22 MED ORDER — DEXAMETHASONE SODIUM PHOSPHATE 4 MG/ML IJ SOLN
INTRAMUSCULAR | Status: DC | PRN
  Administered 2013-12-22: 4 mg via INTRAVENOUS

## 2013-12-22 MED ORDER — FENTANYL CITRATE 0.05 MG/ML IJ SOLN
INTRAMUSCULAR | Status: AC
  Filled 2013-12-22: qty 2

## 2013-12-22 MED ORDER — ONDANSETRON HCL 4 MG/2ML IJ SOLN
INTRAMUSCULAR | Status: DC | PRN
  Administered 2013-12-22: 4 mg via INTRAVENOUS

## 2013-12-22 MED ORDER — PROPOFOL 10 MG/ML IV EMUL
INTRAVENOUS | Status: AC
  Filled 2013-12-22: qty 20

## 2013-12-22 MED ORDER — SODIUM CHLORIDE 0.9 % IV SOLN
INTRAVENOUS | Status: DC | PRN
  Administered 2013-12-22: 15:00:00 via INTRAVENOUS

## 2013-12-22 MED ORDER — FENTANYL CITRATE 0.05 MG/ML IJ SOLN
INTRAMUSCULAR | Status: DC | PRN
  Administered 2013-12-22: 25 ug via INTRAVENOUS
  Administered 2013-12-22: 50 ug via INTRAVENOUS
  Administered 2013-12-22 (×5): 25 ug via INTRAVENOUS

## 2013-12-22 MED ORDER — KETOROLAC TROMETHAMINE 30 MG/ML IJ SOLN
INTRAMUSCULAR | Status: AC
  Filled 2013-12-22: qty 1

## 2013-12-22 MED ORDER — SUCCINYLCHOLINE CHLORIDE 20 MG/ML IJ SOLN
INTRAMUSCULAR | Status: DC | PRN
  Administered 2013-12-22: 120 mg via INTRAVENOUS

## 2013-12-22 MED ORDER — POVIDONE-IODINE 10 % OINT PACKET
TOPICAL_OINTMENT | CUTANEOUS | Status: DC | PRN
  Administered 2013-12-22: 1 via TOPICAL

## 2013-12-22 MED ORDER — DEXAMETHASONE SODIUM PHOSPHATE 4 MG/ML IJ SOLN
INTRAMUSCULAR | Status: AC
  Filled 2013-12-22: qty 1

## 2013-12-22 MED ORDER — ONDANSETRON HCL 4 MG/2ML IJ SOLN
INTRAMUSCULAR | Status: AC
  Filled 2013-12-22: qty 2

## 2013-12-22 MED ORDER — GLYCOPYRROLATE 0.2 MG/ML IJ SOLN
INTRAMUSCULAR | Status: AC
  Filled 2013-12-22: qty 2

## 2013-12-22 MED ORDER — BUPIVACAINE HCL (PF) 0.5 % IJ SOLN
INTRAMUSCULAR | Status: DC | PRN
  Administered 2013-12-22: 10 mL

## 2013-12-22 MED ORDER — BUPIVACAINE HCL (PF) 0.5 % IJ SOLN
INTRAMUSCULAR | Status: AC
  Filled 2013-12-22: qty 30

## 2013-12-22 MED ORDER — LIDOCAINE HCL (PF) 1 % IJ SOLN
INTRAMUSCULAR | Status: AC
  Filled 2013-12-22: qty 5

## 2013-12-22 MED ORDER — POVIDONE-IODINE 10 % EX OINT
TOPICAL_OINTMENT | CUTANEOUS | Status: AC
  Filled 2013-12-22: qty 1

## 2013-12-22 MED ORDER — ONDANSETRON HCL 4 MG PO TABS: 4.0000 mg | ORAL_TABLET | Freq: Three times a day (TID) | ORAL | Status: DC | PRN

## 2013-12-22 MED ORDER — MIDAZOLAM HCL 2 MG/2ML IJ SOLN
INTRAMUSCULAR | Status: DC | PRN
  Administered 2013-12-22: 2 mg via INTRAVENOUS

## 2013-12-22 MED ORDER — DEXTROSE 5 % IV SOLN
2.0000 g | INTRAVENOUS | Status: AC
  Administered 2013-12-22: 2 g via INTRAVENOUS
  Filled 2013-12-22: qty 2

## 2013-12-22 SURGICAL SUPPLY — 50 items
BAG HAMPER (MISCELLANEOUS) ×2 IMPLANT
BAG SPEC RTRVL LRG 6X4 10 (ENDOMECHANICALS) ×1
CLOTH BEACON ORANGE TIMEOUT ST (SAFETY) ×2 IMPLANT
COVER LIGHT HANDLE STERIS (MISCELLANEOUS) ×4 IMPLANT
CUTTER FLEX LINEAR 45M (STAPLE) IMPLANT
CUTTER LINEAR ENDO 35 ETS (STAPLE) ×1 IMPLANT
CUTTER LINEAR ENDO 35 ETS TH (STAPLE) IMPLANT
DECANTER SPIKE VIAL GLASS SM (MISCELLANEOUS) ×2 IMPLANT
DISSECTOR BLUNT TIP ENDO 5MM (MISCELLANEOUS) IMPLANT
DURAPREP 26ML APPLICATOR (WOUND CARE) ×2 IMPLANT
ELECT REM PT RETURN 9FT ADLT (ELECTROSURGICAL) ×2
ELECTRODE REM PT RTRN 9FT ADLT (ELECTROSURGICAL) ×1 IMPLANT
FILTER SMOKE EVAC LAPAROSHD (FILTER) ×2 IMPLANT
FORMALIN 10 PREFIL 120ML (MISCELLANEOUS) ×2 IMPLANT
GLOVE BIO SURGEON STRL SZ 6.5 (GLOVE) ×1 IMPLANT
GLOVE BIOGEL PI IND STRL 7.0 (GLOVE) IMPLANT
GLOVE BIOGEL PI IND STRL 7.5 (GLOVE) IMPLANT
GLOVE BIOGEL PI INDICATOR 7.0 (GLOVE) ×1
GLOVE BIOGEL PI INDICATOR 7.5 (GLOVE) ×2
GLOVE SURG SS PI 7.5 STRL IVOR (GLOVE) ×4 IMPLANT
GOWN STRL REUS W/TWL LRG LVL3 (GOWN DISPOSABLE) ×5 IMPLANT
INST SET LAPROSCOPIC AP (KITS) ×2 IMPLANT
IV NS IRRIG 3000ML ARTHROMATIC (IV SOLUTION) ×1 IMPLANT
KIT ROOM TURNOVER APOR (KITS) ×2 IMPLANT
MANIFOLD NEPTUNE II (INSTRUMENTS) ×2 IMPLANT
NDL INSUFFLATION 14GA 120MM (NEEDLE) ×1 IMPLANT
NEEDLE INSUFFLATION 14GA 120MM (NEEDLE) ×2 IMPLANT
NS IRRIG 1000ML POUR BTL (IV SOLUTION) ×2 IMPLANT
PACK LAP CHOLE LZT030E (CUSTOM PROCEDURE TRAY) ×2 IMPLANT
PAD ARMBOARD 7.5X6 YLW CONV (MISCELLANEOUS) ×2 IMPLANT
PENCIL HANDSWITCHING (ELECTRODE) ×1 IMPLANT
POUCH SPECIMEN RETRIEVAL 10MM (ENDOMECHANICALS) ×2 IMPLANT
RELOAD /EVU35 (ENDOMECHANICALS) IMPLANT
RELOAD 45 VASCULAR/THIN (ENDOMECHANICALS) IMPLANT
RELOAD CUTTER ETS 35MM STAND (ENDOMECHANICALS) IMPLANT
RELOAD STAPLE 45 2.5 WHT GRN (ENDOMECHANICALS) IMPLANT
SCALPEL HARMONIC ACE (MISCELLANEOUS) ×2 IMPLANT
SET BASIN LINEN APH (SET/KITS/TRAYS/PACK) ×2 IMPLANT
SET TUBE IRRIG SUCTION NO TIP (IRRIGATION / IRRIGATOR) ×1 IMPLANT
SPONGE GAUZE 2X2 8PLY STRL LF (GAUZE/BANDAGES/DRESSINGS) ×6 IMPLANT
STAPLER VISISTAT (STAPLE) ×2 IMPLANT
SUT VICRYL 0 UR6 27IN ABS (SUTURE) ×2 IMPLANT
TAPE CLOTH SURG 4X10 WHT LF (GAUZE/BANDAGES/DRESSINGS) ×1 IMPLANT
TRAY FOLEY CATH 16FR SILVER (SET/KITS/TRAYS/PACK) ×2 IMPLANT
TROCAR ENDO BLADELESS 11MM (ENDOMECHANICALS) ×2 IMPLANT
TROCAR ENDO BLADELESS 12MM (ENDOMECHANICALS) ×2 IMPLANT
TROCAR XCEL NON-BLD 5MMX100MML (ENDOMECHANICALS) ×2 IMPLANT
TUBING INSUFFLATION (TUBING) ×2 IMPLANT
WARMER LAPAROSCOPE (MISCELLANEOUS) ×2 IMPLANT
YANKAUER SUCT 12FT TUBE ARGYLE (SUCTIONS) ×2 IMPLANT

## 2013-12-22 NOTE — Transfer of Care (Signed)
Immediate Anesthesia Transfer of Care Note  Patient: Tiffany Kim  Procedure(s) Performed: Procedure(s): APPENDECTOMY LAPAROSCOPIC (N/A)  Patient Location: PACU  Anesthesia Type:General  Level of Consciousness: sedated and patient cooperative  Airway & Oxygen Therapy: Patient Spontanous Breathing and non-rebreather face mask  Post-op Assessment: Report given to PACU RN, Post -op Vital signs reviewed and stable and Patient moving all extremities  Post vital signs: Reviewed and stable  Complications: No apparent anesthesia complications

## 2013-12-22 NOTE — H&P (Signed)
Tiffany Kim is an 73 y.o. female.   Chief Complaint: abdominal pain  HPI: 73yo wf who presents with a less than 24 hour h/o right lower quadrant pain.  CT scan shows acute appendicitis.    Past Medical History  Diagnosis Date  . IBS (irritable bowel syndrome)   . Hypothyroidism     Past Surgical History  Procedure Laterality Date  . Abdominal hysterectomy    . Cholecystectomy    . Foot surgery    . Breast biopsy    . Thyroidectomy    . Bunionectomy  10/22/2011    Procedure: Tiffany Kim;  Surgeon: Tiffany Kim, DPM;  Location: [facility];  Service: Orthopedics;  Laterality: Right;  [place] Bunionectomy Right Foot  . Metatarsal osteotomy  10/22/2011    Procedure: METATARSAL OSTEOTOMY;  Surgeon: Tiffany Kim, DPM;  Location: [facility];  Service: Orthopedics;  Laterality: Right;  Possible [place] Osteotomy Right Foot  . Hammer toe surgery  10/22/2011    Procedure: HAMMER TOE CORRECTION;  Surgeon: Tiffany Kim, DPM;  Location: [facility];  Service: Orthopedics;  Laterality: Right;  Arthroplasty 5th Toe Right Foot    Family History  Problem Relation Age of Onset  . Heart attack Father     Age 80  . Heart attack Mother     Late 76s  . COPD Sister   . Hypertension Brother   . Breast cancer Sister    Social History:  reports that she has never smoked. She does not have any smokeless tobacco history on file. She reports that she drinks about 1.2 ounces of alcohol per week. She reports that she does not use illicit drugs.  Allergies:  Allergies  Allergen Reactions  . Codeine Nausea And Vomiting    Medications Prior to Admission  Medication Sig Dispense Refill  . Biotin 5000 MCG CAPS Take 5,000 mcg by mouth daily.      . Cholecalciferol (D3-1000 PO) Take 1 capsule by mouth daily.      Marland Kitchen estradiol (CLIMARA - DOSED IN MG/24 HR) 0.05 mg/24hr patch APPLY ONE PATCH WEEKLY.  4 patch  6  . hydrochlorothiazide (HYDRODIURIL) 25 MG tablet TAKE 1/2 TO 1 TABLET DAILY AS NEEDED  FOR FLUID.  30 tablet  3  . multivitamin (THERAGRAN) per tablet Take 1 tablet by mouth daily.      . multivitamin-lutein (OCUVITE-LUTEIN) CAPS capsule Take 1 capsule by mouth daily.      . naproxen (NAPROSYN) 500 MG tablet Take 500 mg by mouth 2 (two) times daily with a meal.      . Omega-3 Fatty Acids (FISH OIL) 1000 MG CAPS Take 1,000 mg by mouth daily.      Marland Kitchen SYNTHROID 100 MCG tablet TAKE (1) TABLET BY MOUTH DAILY.  90 tablet  3    No results found for this or any previous visit (from the past 48 hour(s)). Ct Abdomen Pelvis W Contrast  12/22/2013   CLINICAL DATA:  Right lower quadrant pain.  EXAM: CT ABDOMEN AND PELVIS WITH CONTRAST  TECHNIQUE: Multidetector CT imaging of the abdomen and pelvis was performed using the standard protocol following bolus administration of intravenous contrast.  CONTRAST:  198mL OMNIPAQUE IOHEXOL 300 MG/ML  SOLN  COMPARISON:  CT abdomen and pelvis with contrast 04/12/2009  FINDINGS: The lung bases are clear without focal nodule, mass, or airspace disease. The heart size is normal. Coronary artery calcifications are evident. No significant pleural or pericardial effusion is present.  Mild fatty infiltration of the  liver is present. No focal hepatic lesions are evident. The liver is mildly enlarged extending to the left lateral margin of the peritoneal cavity. The spleen is within normal limits. 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 The stomach, duodenum, and pancreas are within normal limits. The common bile duct is within normal limits following cholecystectomy. The adrenal glands are normal bilaterally. The kidneys and ureters are unremarkable. The urinary bladder is within normal limits.  Diverticular changes are present in the sigmoid colon. The more proximal colon is within normal limits. A 9 mm appendicolith is present at the base the appendix. The more distal appendix is enlarged, measuring up to 15 mm with gas and marked inflammatory changes. No definite  free air is present. Inflammatory changes are associated with the adjacent small bowel and sigmoid colon. The remainder of the the small bowel is normal.  Mesenteric stranding is present in the right lower quadrant. Small lymph nodes are evident.  The bone windows demonstrate mild degenerative changes at L5-S1. No focal lytic or blastic lesions are evident.  IMPRESSION: 1. Appendicitis with a 9 mm appendicolith and marked dilation the appendix and surrounding inflammatory change. 2. No definite free air or abscess. The structures surrounding the tip of the appendix appear to be sigmoid colon and small bowel with secondary inflammation. 3. Sigmoid diverticulosis. No evidence for diverticulitis. Inflammatory changes are confined to the area of the appendix and are likely secondary to appendicitis. 4. Atherosclerosis including coronary artery disease. 5. Stable hepatic enlargement. 6. 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   Electronically Signed   By: Lawrence Santiago M.D.   On: 12/22/2013 14:03    Review of Systems  Constitutional: Positive for malaise/fatigue.  HENT: Negative.   Eyes: Negative.   Respiratory: Negative.   Cardiovascular: Negative.   Gastrointestinal: Positive for nausea and abdominal pain.  Genitourinary: Negative.   Musculoskeletal: Negative.   Skin: Negative.   Neurological: Negative.   Endo/Heme/Allergies: Negative.     Pulse 85, temperature 97.8 F (36.6 C), temperature source Oral, height 5' 3.5" (1.613 m), weight 57.153 kg (126 lb). Physical Exam  Constitutional: She is oriented to person, place, and time. She appears well-developed and well-nourished.  HENT:  Head: Normocephalic and atraumatic.  Neck: Normal range of motion. Neck supple.  Cardiovascular: Normal rate, regular rhythm and normal heart sounds.   Respiratory: Effort normal and breath sounds normal.  GI: Soft. She exhibits no distension.  There is tenderness. There is no rebound.  Neurological: She is alert and oriented to person, place, and time.  Skin: Skin is warm and dry.     Assessment/Plan Imp:  Acute appendicitis Plan:  To OR for laparoscopic appendicitis.  Risks and benefits of procedure were fully explained to the patient, who gives informed consent.  Railynn Ballo A 12/22/2013, 3:15 PM

## 2013-12-22 NOTE — Anesthesia Preprocedure Evaluation (Signed)
Anesthesia Evaluation  Patient identified by MRN, date of birth, ID band Patient awake    Reviewed: Allergy & Precautions, H&P , NPO status , Patient's Chart, lab work & pertinent test results  History of Anesthesia Complications (+) PONV and history of anesthetic complications  Airway Mallampati: II      Dental  (+) Teeth Intact, Caps, Dental Advisory Given,    Pulmonary neg pulmonary ROS,  breath sounds clear to auscultation        Cardiovascular Exercise Tolerance: Good negative cardio ROS  Rhythm:Regular     Neuro/Psych    GI/Hepatic GERD-  Medicated and Controlled,  Endo/Other  Hypothyroidism   Renal/GU      Musculoskeletal   Abdominal (+)  Abdomen: tender.    Peds  Hematology   Anesthesia Other Findings   Reproductive/Obstetrics                           Anesthesia Physical Anesthesia Plan  ASA: II and emergent  Anesthesia Plan: General   Post-op Pain Management:    Induction: Intravenous, Rapid sequence and Cricoid pressure planned  Airway Management Planned: Oral ETT  Additional Equipment:   Intra-op Plan:   Post-operative Plan: Extubation in OR  Informed Consent: I have reviewed the patients History and Physical, chart, labs and discussed the procedure including the risks, benefits and alternatives for the proposed anesthesia with the patient or authorized representative who has indicated his/her understanding and acceptance.   Dental advisory given  Plan Discussed with: Anesthesiologist and Surgeon  Anesthesia Plan Comments:         Anesthesia Quick Evaluation                                  Anesthesia Evaluation

## 2013-12-22 NOTE — Anesthesia Procedure Notes (Signed)
Procedure Name: Intubation Date/Time: 12/22/2013 3:41 PM Performed by: Vista Deck Pre-anesthesia Checklist: Patient identified, Patient being monitored, Timeout performed, Emergency Drugs available and Suction available Patient Re-evaluated:Patient Re-evaluated prior to inductionOxygen Delivery Method: Circle System Utilized Preoxygenation: Pre-oxygenation with 100% oxygen Intubation Type: IV induction, Rapid sequence and Cricoid Pressure applied Laryngoscope Size: Miller and 2 Grade View: Grade II Tube type: Oral Tube size: 7.0 mm Number of attempts: 1 Airway Equipment and Method: stylet Placement Confirmation: ETT inserted through vocal cords under direct vision,  positive ETCO2 and breath sounds checked- equal and bilateral Secured at: 21 cm Tube secured with: Tape Dental Injury: Teeth and Oropharynx as per pre-operative assessment

## 2013-12-22 NOTE — Anesthesia Postprocedure Evaluation (Signed)
  Anesthesia Post-op Note  Patient: Tiffany Kim  Procedure(s) Performed: Procedure(s): APPENDECTOMY LAPAROSCOPIC (N/A)  Patient Location: PACU  Anesthesia Type:General  Level of Consciousness: sedated and patient cooperative  Airway and Oxygen Therapy: Patient Spontanous Breathing and non-rebreather face mask  Post-op Pain: none  Post-op Assessment: Post-op Vital signs reviewed, Patient's Cardiovascular Status Stable, Respiratory Function Stable, Patent Airway and Pain level controlled  Post-op Vital Signs: Reviewed and stable  Last Vitals:  Filed Vitals:   12/22/13 1646  BP: 133/66  Pulse: 77  Temp: 37 C  Resp: 24    Complications: No apparent anesthesia complications

## 2013-12-22 NOTE — Discharge Instructions (Signed)
Laparoscopic Appendectomy Care After Refer to this sheet in the next few weeks. These instructions provide you with information on caring for yourself after your procedure. Your caregiver may also give you more specific instructions. Your treatment has been planned according to current medical practices, but problems sometimes occur. Call your caregiver if you have any problems or questions after your procedure. HOME CARE INSTRUCTIONS  Do not drive while taking narcotic pain medicines.  Use stool softener if you become constipated from your pain medicines.  Change your bandages (dressings) as directed.  Keep your wounds clean and dry. You may wash the wounds gently with soap and water. Gently pat the wounds dry with a clean towel.  Do not take baths, swim, or use hot tubs for 10 days, or as instructed by your caregiver.  Only take over-the-counter or prescription medicines for pain, discomfort, or fever as directed by your caregiver.  You may continue your normal diet as directed.  Do not lift more than 10 pounds (4.5 kg) or play contact sports for 3 weeks, or as directed.  Slowly increase your activity after surgery.  Take deep breaths to avoid getting a lung infection (pneumonia). SEEK MEDICAL CARE IF:  You have redness, swelling, or increasing pain in your wounds.  You have pus coming from your wounds.  You have drainage from a wound that lasts longer than 1 day.  You notice a bad smell coming from the wounds or dressing.  Your wound edges break open after stitches (sutures) have been removed.  You notice increasing pain in the shoulders (shoulder strap areas) or near your shoulder blades.  You develop dizzy episodes or fainting while standing.  You develop shortness of breath.  You develop persistent nausea or vomiting.  You cannot control your bowel functions or lose your appetite.  You develop diarrhea. SEEK IMMEDIATE MEDICAL CARE IF:   You have a fever.  You  develop a rash.  You have difficulty breathing or sharp pains in your chest.  You develop any reaction or side effects to medicines given. MAKE SURE YOU:  Understand these instructions.  Will watch your condition.  Will get help right away if you are not doing well or get worse. Document Released: 06/01/2005 Document Revised: 08/24/2011 Document Reviewed: 12/09/2010 [facility] Patient Information 2015 [place], [place]. This information is not intended to replace advice given to you by your health care provider. Make sure you discuss any questions you have with your health care provider.

## 2013-12-22 NOTE — Op Note (Signed)
Patient:  Tiffany Kim  DOB:  [birth year]  MRN:  [id number]   Preop Diagnosis:  Acute appendicitis   Postop Diagnosis:  Same  Procedure:  Laparoscopic appendectomy   Surgeon:  Aviva Signs, MD   Anes:  GET  Indications:  Patient is a 73yo wf who presents with <24 hr h/o right lower quadrant abdominal pain.  CT scan of the abdomen shows acute appendicitis.  The risks and benefits of the procedure including bleeding, infection, and the possibility of an open procedure were fully explained to the patient, who gives informed consent.  Procedure note:  The patient was placed in the supine position.  After induction of GET, the abdomen was prepped and draped using the usual sterile technique with duraprep.  Surgical site confirmation was performed.  A supraumbilical incision was made done to the fascia.  A veress needle was inserted into the peritoneal cavity and confirmation of placement was done using the saline drop test.  The abdomen was then insufflated to 74mm Hg of pressure without difficulty.  An 80mm trochar was introduced into the abodminal cavity under direct visualization without difficulty.  The patient was placed in deeper trendelenberg and an additional 79mm trochar was placed in the suprapubic region and a 51mm trochar was placed in the left lower quadrant.  The appendix was visualized and noted to be diffusely gangrenous.  While manipulating it, a small amount of luminal contents did spill, though it was localized.  The mesoappendix was divided using the harmonic scalpel.  A standard endoGIA was placed across the healthy base of the appendix and fired.  The appendix was then removed using the endocatch bag without difficulty.The right lower quadrant was copiously irrigated with normal saline.  All fluid and air were evacuated from the abdominal cavity prior to removal of the trochars.  All wounds were irrigated with normal saline.  All wounds were injected with .5% xylocaine.  The  supraumbilical fascia as well as epigastric fascia were reapproximated using o vicryl interrupted sutures.  All skin incisions were closed using staples.  Betadine ointment and dry sterile dressings were applied.    The patient was extubated in the OR and transferred to the PACU in stable condition.  Complications:  none  EBL:  Minimal   Specimen:  appendix

## 2013-12-25 ENCOUNTER — Encounter (HOSPITAL_COMMUNITY): Payer: Self-pay | Admitting: General Surgery

## 2014-01-04 DIAGNOSIS — M19049 Primary osteoarthritis, unspecified hand: Secondary | ICD-10-CM | POA: Diagnosis not present

## 2014-01-04 DIAGNOSIS — M24139 Other articular cartilage disorders, unspecified wrist: Secondary | ICD-10-CM | POA: Diagnosis not present

## 2014-03-06 DIAGNOSIS — H8190 Unspecified disorder of vestibular function, unspecified ear: Secondary | ICD-10-CM | POA: Diagnosis not present

## 2014-03-06 DIAGNOSIS — H9209 Otalgia, unspecified ear: Secondary | ICD-10-CM | POA: Diagnosis not present

## 2014-03-06 DIAGNOSIS — R42 Dizziness and giddiness: Secondary | ICD-10-CM | POA: Diagnosis not present

## 2014-04-04 DIAGNOSIS — M24231 Disorder of ligament, right wrist: Secondary | ICD-10-CM | POA: Diagnosis not present

## 2014-04-04 DIAGNOSIS — M1811 Unilateral primary osteoarthritis of first carpometacarpal joint, right hand: Secondary | ICD-10-CM | POA: Diagnosis not present

## 2014-04-23 ENCOUNTER — Other Ambulatory Visit: Payer: Self-pay | Admitting: Adult Health

## 2014-05-14 ENCOUNTER — Other Ambulatory Visit: Payer: Self-pay | Admitting: Adult Health

## 2014-06-11 DIAGNOSIS — J069 Acute upper respiratory infection, unspecified: Secondary | ICD-10-CM | POA: Diagnosis not present

## 2014-06-11 DIAGNOSIS — J40 Bronchitis, not specified as acute or chronic: Secondary | ICD-10-CM | POA: Diagnosis not present

## 2014-06-29 DIAGNOSIS — Z23 Encounter for immunization: Secondary | ICD-10-CM | POA: Diagnosis not present

## 2014-08-06 DIAGNOSIS — M79661 Pain in right lower leg: Secondary | ICD-10-CM | POA: Diagnosis not present

## 2014-08-18 ENCOUNTER — Other Ambulatory Visit: Payer: Self-pay | Admitting: Adult Health

## 2014-08-20 DIAGNOSIS — M79661 Pain in right lower leg: Secondary | ICD-10-CM | POA: Diagnosis not present

## 2014-08-22 ENCOUNTER — Encounter: Payer: Self-pay | Admitting: Adult Health

## 2014-08-22 ENCOUNTER — Ambulatory Visit (INDEPENDENT_AMBULATORY_CARE_PROVIDER_SITE_OTHER): Payer: Medicare Other | Admitting: Adult Health

## 2014-08-22 VITALS — BP 160/88 | HR 64 | Ht 62.5 in | Wt 129.5 lb

## 2014-08-22 DIAGNOSIS — Z01419 Encounter for gynecological examination (general) (routine) without abnormal findings: Secondary | ICD-10-CM | POA: Diagnosis not present

## 2014-08-22 DIAGNOSIS — I10 Essential (primary) hypertension: Secondary | ICD-10-CM | POA: Diagnosis not present

## 2014-08-22 DIAGNOSIS — E038 Other specified hypothyroidism: Secondary | ICD-10-CM | POA: Diagnosis not present

## 2014-08-22 DIAGNOSIS — Z79818 Long term (current) use of other agents affecting estrogen receptors and estrogen levels: Secondary | ICD-10-CM

## 2014-08-22 DIAGNOSIS — Z1212 Encounter for screening for malignant neoplasm of rectum: Secondary | ICD-10-CM

## 2014-08-22 DIAGNOSIS — Z124 Encounter for screening for malignant neoplasm of cervix: Secondary | ICD-10-CM | POA: Diagnosis not present

## 2014-08-22 DIAGNOSIS — Z79899 Other long term (current) drug therapy: Secondary | ICD-10-CM

## 2014-08-22 DIAGNOSIS — R42 Dizziness and giddiness: Secondary | ICD-10-CM

## 2014-08-22 HISTORY — DX: Dizziness and giddiness: R42

## 2014-08-22 HISTORY — DX: Long term (current) use of other agents affecting estrogen receptors and estrogen levels: Z79.818

## 2014-08-22 HISTORY — DX: Other long term (current) drug therapy: Z79.899

## 2014-08-22 HISTORY — DX: Essential (primary) hypertension: I10

## 2014-08-22 NOTE — Progress Notes (Signed)
Patient ID: Tiffany Kim, female   DOB: [birth year], 74 y.o.   MRN: [id number] History of Present Illness: Tiffany Kim is a 74 year old white female, married in for well woman gyn exam, she is sp hysterectomy and is on estrogen patch and wants to stay on it. She complains of dizziness on and of esp with position change in bed.Is currently on allergy meds from Dr Nevada Crane.She plays tennis often.   Current Medications, Allergies, Past Medical History, Past Surgical History, Family History and Social History were reviewed in [employer] record.     Review of Systems: Patient denies any daily headaches(?allergies), hearing loss, fatigue, blurred vision, shortness of breath, chest pain, abdominal pain, problems with bowel movements, urination, or intercourse(not having sex). No joint pain or mood swings.+ dizziness.    Physical Exam:BP 160/88 mmHg  Pulse 64  Ht 5' 2.5" (1.588 m)  Wt 129 lb 8 oz (58.741 kg)  BMI 23.29 kg/m2 BP recheck 192/92 right arm. General:  Well developed, well nourished, no acute distress Skin:  Warm and dry Neck:  Midline trachea, thyroid not palpated, good ROM, no lymphadenopathy, no carotid bruits heard and ears TM pearly gray with +light reflex CN 2-12 intact Lungs; Clear to auscultation bilaterally Breast:  No dominant palpable mass, retraction, or nipple discharge Cardiovascular: Regular rate and rhythm Abdomen:  Soft, non tender, no hepatosplenomegaly Pelvic:  External genitalia is normal in appearance, no lesions.  The vagina has loss of rugae, pale pink with good moisture. Urethra has no lesions or masses. The cervix and uterus are absent.  No adnexal masses or tenderness noted.Bladder is non tender, no masses felt. Rectal: Good sphincter tone, no polyps, or hemorrhoids felt.  Hemoccult negative. Extremities/musculoskeletal:  No swelling or varicosities noted, no clubbing or cyanosis Psych:  No mood changes, alert and cooperative,seems happy She was  given BP meds in past and add reaction could not move, has HCTZ at home for swelling, will get her to take 1 daily for next week and recheck BP.  Impression:  Well woman gyn exam no pap Dizziness  Hypertension ET Hypothyroidism    Plan: Take hydodiuril 25 mg 1 daily for now Return in 1 week for BP check Check CBC,CMP and TSH today Can try antivert OTC Continue climara patch, has refills

## 2014-08-22 NOTE — Patient Instructions (Signed)
Take fluid daily for 1 week  Return in 1 week for BP check Can try antivert for dizziness Physical in 2 years Mammogram yearly

## 2014-08-23 LAB — COMPREHENSIVE METABOLIC PANEL
A/G RATIO: 1.8 (ref 1.1–2.5)
ALK PHOS: 58 IU/L (ref 39–117)
ALT: 22 IU/L (ref 0–32)
AST: 25 IU/L (ref 0–40)
Albumin: 4.4 g/dL (ref 3.5–4.8)
BILIRUBIN TOTAL: 0.4 mg/dL (ref 0.0–1.2)
BUN/Creatinine Ratio: 34 — ABNORMAL HIGH (ref 11–26)
BUN: 19 mg/dL (ref 8–27)
CHLORIDE: 100 mmol/L (ref 97–108)
CO2: 23 mmol/L (ref 18–29)
CREATININE: 0.56 mg/dL — AB (ref 0.57–1.00)
Calcium: 9.4 mg/dL (ref 8.7–10.3)
GFR calc Af Amer: 108 mL/min/{1.73_m2} (ref >59)
GFR, EST NON AFRICAN AMERICAN: 94 mL/min/{1.73_m2} (ref >59)
Globulin, Total: 2.5 g/dL (ref 1.5–4.5)
Glucose: 92 mg/dL (ref 65–99)
POTASSIUM: 4.7 mmol/L (ref 3.5–5.2)
Sodium: 139 mmol/L (ref 134–144)
TOTAL PROTEIN: 6.9 g/dL (ref 6.0–8.5)

## 2014-08-23 LAB — CBC
HCT: 40.8 % (ref 34.0–46.6)
Hemoglobin: 13.9 g/dL (ref 11.1–15.9)
MCH: 33.7 pg — ABNORMAL HIGH (ref 26.6–33.0)
MCHC: 34.1 g/dL (ref 31.5–35.7)
MCV: 99 fL — ABNORMAL HIGH (ref 79–97)
Platelets: 260 10*3/uL (ref 150–379)
RBC: 4.12 x10E6/uL (ref 3.77–5.28)
RDW: 13.5 % (ref 12.3–15.4)
WBC: 5.3 10*3/uL (ref 3.4–10.8)

## 2014-08-23 LAB — TSH: TSH: 1.53 u[IU]/mL (ref 0.450–4.500)

## 2014-08-24 ENCOUNTER — Other Ambulatory Visit: Payer: Self-pay | Admitting: Adult Health

## 2014-08-24 LAB — HEMOCCULT GUIAC POC 1CARD (OFFICE): Fecal Occult Blood, POC: NEGATIVE

## 2014-08-27 ENCOUNTER — Telehealth: Payer: Self-pay | Admitting: Adult Health

## 2014-08-27 NOTE — Telephone Encounter (Signed)
Pt aware of labs taking meds, no side effects, she says BP up in mornings and low er in evenings, has appt Wednesday

## 2014-08-29 ENCOUNTER — Ambulatory Visit (INDEPENDENT_AMBULATORY_CARE_PROVIDER_SITE_OTHER): Payer: Medicare Other | Admitting: Adult Health

## 2014-08-29 ENCOUNTER — Encounter: Payer: Self-pay | Admitting: Adult Health

## 2014-08-29 VITALS — BP 158/82 | HR 60 | Ht 62.5 in | Wt 130.0 lb

## 2014-08-29 DIAGNOSIS — I10 Essential (primary) hypertension: Secondary | ICD-10-CM | POA: Diagnosis not present

## 2014-08-29 MED ORDER — LISINOPRIL-HYDROCHLOROTHIAZIDE 10-12.5 MG PO TABS: 1.0000 | ORAL_TABLET | Freq: Every day | ORAL | Status: DC

## 2014-08-29 NOTE — Progress Notes (Signed)
Subjective:     Patient ID: Tiffany Kim, female   DOB: 1940/07/08, 74 y.o.   MRN: [id number]  HPI [name] is a 74 year old white female in for BP check, has been taking HCTZ 25 mg daily since 08/22/14 when noted BP 160/88.She pressures are higher in mornings and then normal in evenings per her recordings.Has had dizziness once in last week, noticed last night when turning over in bed.  Review of Systems  +dizziness at times with turning over in bed. Reviewed past medical,surgical, social and family history. Reviewed medications and allergies.     Objective:   Physical Exam BP 158/82 mmHg  Pulse 60  Ht 5' 2.5" (1.588 m)  Wt 130 lb (58.968 kg)  BMI 23.38 kg/m2   Skin warm and dry.  Lungs: clear to ausculation bilaterally. Cardiovascular: regular rate and rhythm.Called Dr Wende Neighbors her PCP and discussed with him, will stop HCTZ and Rx lisinopril/HCT per him and will have her make appt in 3-4 weeks for him to recheck BP.He is aware TSH 1.530 and BUN 34, creat. 0.56. On labs from 08/22/14.  Assessment:     Hypertension     Plan:     Rx lisinopril.HCT 10/12.5 mg #30 with 3 refills take 1 daily Make appt with Dr Nevada Crane for 3-4 weeks Call prn problem

## 2014-08-29 NOTE — Patient Instructions (Signed)
Make appt with Dr Nevada Crane Follow up prn

## 2014-10-01 DIAGNOSIS — I10 Essential (primary) hypertension: Secondary | ICD-10-CM | POA: Diagnosis not present

## 2014-10-01 DIAGNOSIS — M79605 Pain in left leg: Secondary | ICD-10-CM | POA: Diagnosis not present

## 2014-10-01 DIAGNOSIS — L720 Epidermal cyst: Secondary | ICD-10-CM | POA: Diagnosis not present

## 2014-10-01 DIAGNOSIS — Z6822 Body mass index (BMI) 22.0-22.9, adult: Secondary | ICD-10-CM | POA: Diagnosis not present

## 2014-11-05 ENCOUNTER — Other Ambulatory Visit: Payer: Self-pay | Admitting: Adult Health

## 2014-11-05 DIAGNOSIS — Z1231 Encounter for screening mammogram for malignant neoplasm of breast: Secondary | ICD-10-CM

## 2014-11-06 ENCOUNTER — Ambulatory Visit (HOSPITAL_COMMUNITY): Payer: BLUE CROSS/BLUE SHIELD

## 2014-11-06 ENCOUNTER — Ambulatory Visit (HOSPITAL_COMMUNITY)
Admission: RE | Admit: 2014-11-06 | Discharge: 2014-11-06 | Disposition: A | Discharge status: Home / Self Care | Payer: Medicare Other | Source: Ambulatory Visit | Attending: Adult Health | Admitting: Adult Health

## 2014-11-06 DIAGNOSIS — Z1231 Encounter for screening mammogram for malignant neoplasm of breast: Secondary | ICD-10-CM | POA: Insufficient documentation

## 2014-11-07 ENCOUNTER — Telehealth: Payer: Self-pay | Admitting: Adult Health

## 2014-11-07 NOTE — Telephone Encounter (Signed)
Call Dr Nevada Crane in am to change BP meds it is the lisinopril that is probably causing the cough

## 2014-11-07 NOTE — Telephone Encounter (Signed)
Spoke with pt. Tiffany Kim put her on BP med, Lisinopril/HCTZ and it is causing a dry cough. Please advise. Thanks!! [employer]

## 2014-11-16 ENCOUNTER — Other Ambulatory Visit: Payer: Self-pay | Admitting: Adult Health

## 2014-12-06 DIAGNOSIS — Z6823 Body mass index (BMI) 23.0-23.9, adult: Secondary | ICD-10-CM | POA: Diagnosis not present

## 2014-12-06 DIAGNOSIS — M25522 Pain in left elbow: Secondary | ICD-10-CM | POA: Diagnosis not present

## 2014-12-10 ENCOUNTER — Other Ambulatory Visit: Payer: Self-pay

## 2015-02-01 ENCOUNTER — Other Ambulatory Visit: Payer: Self-pay | Admitting: Adult Health

## 2015-02-14 ENCOUNTER — Ambulatory Visit (INDEPENDENT_AMBULATORY_CARE_PROVIDER_SITE_OTHER): Payer: Medicare Other | Admitting: Otolaryngology

## 2015-02-14 DIAGNOSIS — R1312 Dysphagia, oropharyngeal phase: Secondary | ICD-10-CM

## 2015-02-14 DIAGNOSIS — K219 Gastro-esophageal reflux disease without esophagitis: Secondary | ICD-10-CM | POA: Diagnosis not present

## 2015-02-15 ENCOUNTER — Other Ambulatory Visit (INDEPENDENT_AMBULATORY_CARE_PROVIDER_SITE_OTHER): Payer: Self-pay | Admitting: Otolaryngology

## 2015-02-15 DIAGNOSIS — R1310 Dysphagia, unspecified: Secondary | ICD-10-CM

## 2015-02-20 ENCOUNTER — Ambulatory Visit (HOSPITAL_COMMUNITY)
Admission: RE | Admit: 2015-02-20 | Discharge: 2015-02-20 | Disposition: A | Discharge status: Home / Self Care | Payer: Medicare Other | Source: Ambulatory Visit | Attending: Otolaryngology | Admitting: Otolaryngology

## 2015-02-20 DIAGNOSIS — R1310 Dysphagia, unspecified: Secondary | ICD-10-CM | POA: Insufficient documentation

## 2015-03-14 ENCOUNTER — Ambulatory Visit (INDEPENDENT_AMBULATORY_CARE_PROVIDER_SITE_OTHER): Payer: Medicare Other | Admitting: Otolaryngology

## 2015-03-14 DIAGNOSIS — R590 Localized enlarged lymph nodes: Secondary | ICD-10-CM | POA: Diagnosis not present

## 2015-03-14 DIAGNOSIS — R1312 Dysphagia, oropharyngeal phase: Secondary | ICD-10-CM | POA: Diagnosis not present

## 2015-04-25 ENCOUNTER — Ambulatory Visit (INDEPENDENT_AMBULATORY_CARE_PROVIDER_SITE_OTHER): Payer: Medicare Other | Admitting: Otolaryngology

## 2015-04-25 DIAGNOSIS — R1312 Dysphagia, oropharyngeal phase: Secondary | ICD-10-CM

## 2015-05-15 ENCOUNTER — Other Ambulatory Visit: Payer: Self-pay | Admitting: Adult Health

## 2015-06-21 DIAGNOSIS — J329 Chronic sinusitis, unspecified: Secondary | ICD-10-CM | POA: Diagnosis not present

## 2015-06-21 DIAGNOSIS — R091 Pleurisy: Secondary | ICD-10-CM | POA: Diagnosis not present

## 2015-06-21 DIAGNOSIS — J069 Acute upper respiratory infection, unspecified: Secondary | ICD-10-CM | POA: Diagnosis not present

## 2015-07-17 DIAGNOSIS — L570 Actinic keratosis: Secondary | ICD-10-CM | POA: Diagnosis not present

## 2015-07-17 DIAGNOSIS — D225 Melanocytic nevi of trunk: Secondary | ICD-10-CM | POA: Diagnosis not present

## 2015-07-17 DIAGNOSIS — X32XXXD Exposure to sunlight, subsequent encounter: Secondary | ICD-10-CM | POA: Diagnosis not present

## 2015-07-31 DIAGNOSIS — I10 Essential (primary) hypertension: Secondary | ICD-10-CM | POA: Diagnosis not present

## 2015-07-31 DIAGNOSIS — E559 Vitamin D deficiency, unspecified: Secondary | ICD-10-CM | POA: Diagnosis not present

## 2015-07-31 DIAGNOSIS — E782 Mixed hyperlipidemia: Secondary | ICD-10-CM | POA: Diagnosis not present

## 2015-07-31 DIAGNOSIS — E039 Hypothyroidism, unspecified: Secondary | ICD-10-CM | POA: Diagnosis not present

## 2015-08-02 DIAGNOSIS — E039 Hypothyroidism, unspecified: Secondary | ICD-10-CM | POA: Diagnosis not present

## 2015-08-02 DIAGNOSIS — E559 Vitamin D deficiency, unspecified: Secondary | ICD-10-CM | POA: Diagnosis not present

## 2015-08-02 DIAGNOSIS — I10 Essential (primary) hypertension: Secondary | ICD-10-CM | POA: Diagnosis not present

## 2015-08-02 DIAGNOSIS — M79661 Pain in right lower leg: Secondary | ICD-10-CM | POA: Diagnosis not present

## 2015-08-12 ENCOUNTER — Other Ambulatory Visit: Payer: Self-pay | Admitting: Adult Health

## 2015-10-16 ENCOUNTER — Other Ambulatory Visit: Payer: Self-pay | Admitting: Adult Health

## 2015-11-20 DIAGNOSIS — M1811 Unilateral primary osteoarthritis of first carpometacarpal joint, right hand: Secondary | ICD-10-CM | POA: Insufficient documentation

## 2015-11-20 DIAGNOSIS — M25541 Pain in joints of right hand: Secondary | ICD-10-CM | POA: Diagnosis not present

## 2016-01-27 DIAGNOSIS — I10 Essential (primary) hypertension: Secondary | ICD-10-CM | POA: Diagnosis not present

## 2016-01-27 DIAGNOSIS — E039 Hypothyroidism, unspecified: Secondary | ICD-10-CM | POA: Diagnosis not present

## 2016-01-27 DIAGNOSIS — H8110 Benign paroxysmal vertigo, unspecified ear: Secondary | ICD-10-CM | POA: Diagnosis not present

## 2016-01-27 DIAGNOSIS — E559 Vitamin D deficiency, unspecified: Secondary | ICD-10-CM | POA: Diagnosis not present

## 2016-02-25 DIAGNOSIS — M7740 Metatarsalgia, unspecified foot: Secondary | ICD-10-CM | POA: Diagnosis not present

## 2016-02-25 DIAGNOSIS — M79672 Pain in left foot: Secondary | ICD-10-CM | POA: Diagnosis not present

## 2016-03-23 DIAGNOSIS — H5202 Hypermetropia, left eye: Secondary | ICD-10-CM | POA: Diagnosis not present

## 2016-03-23 DIAGNOSIS — H5211 Myopia, right eye: Secondary | ICD-10-CM | POA: Diagnosis not present

## 2016-03-23 DIAGNOSIS — H3530 Unspecified macular degeneration: Secondary | ICD-10-CM | POA: Diagnosis not present

## 2016-03-23 DIAGNOSIS — H52223 Regular astigmatism, bilateral: Secondary | ICD-10-CM | POA: Diagnosis not present

## 2016-04-13 ENCOUNTER — Other Ambulatory Visit: Payer: Self-pay | Admitting: Adult Health

## 2016-04-22 DIAGNOSIS — Z23 Encounter for immunization: Secondary | ICD-10-CM | POA: Diagnosis not present

## 2016-05-06 DIAGNOSIS — M1811 Unilateral primary osteoarthritis of first carpometacarpal joint, right hand: Secondary | ICD-10-CM | POA: Diagnosis not present

## 2016-07-24 DIAGNOSIS — J111 Influenza due to unidentified influenza virus with other respiratory manifestations: Secondary | ICD-10-CM | POA: Diagnosis not present

## 2016-07-24 DIAGNOSIS — J329 Chronic sinusitis, unspecified: Secondary | ICD-10-CM | POA: Diagnosis not present

## 2016-07-24 DIAGNOSIS — J029 Acute pharyngitis, unspecified: Secondary | ICD-10-CM | POA: Diagnosis not present

## 2016-07-27 ENCOUNTER — Other Ambulatory Visit: Payer: Medicare Other | Admitting: Adult Health

## 2016-08-12 ENCOUNTER — Encounter: Payer: Self-pay | Admitting: Adult Health

## 2016-08-12 ENCOUNTER — Ambulatory Visit (INDEPENDENT_AMBULATORY_CARE_PROVIDER_SITE_OTHER): Payer: Medicare Other | Admitting: Adult Health

## 2016-08-12 VITALS — BP 140/80 | HR 62 | Ht 63.0 in | Wt 130.5 lb

## 2016-08-12 DIAGNOSIS — N8110 Cystocele, unspecified: Secondary | ICD-10-CM

## 2016-08-12 DIAGNOSIS — N3946 Mixed incontinence: Secondary | ICD-10-CM | POA: Diagnosis not present

## 2016-08-12 DIAGNOSIS — N816 Rectocele: Secondary | ICD-10-CM

## 2016-08-12 DIAGNOSIS — Z79899 Other long term (current) drug therapy: Secondary | ICD-10-CM

## 2016-08-12 NOTE — Progress Notes (Signed)
Subjective:     Patient ID: Tiffany Kim, female   DOB: [birth year], 76 y.o.   MRN: [id number]  HPI Tiffany is a 76 year old white female, married, G2P2 sp hysterectomy on ET(patch), in complaining of bladder falling down and stress,urge incontinence.She is active and plays tennis 2 days a week. And goes to [facility] most weekends.Husband has bone cancer now but doing well, still working and works on [employer].Her family is going to [facility] 3/24. PCP is Dr Nevada Crane.   Review of Systems ?bladder falling down +stress and urge incontinence  Reviewed past medical,surgical, social and family history. Reviewed medications and allergies.     Objective:   Physical Exam BP 140/80 (BP Location: Left Arm, Patient Position: Sitting, Cuff Size: Normal)   Pulse 62   Ht 5\' 3"  (1.6 m)   Wt 130 lb 8 oz (59.2 kg)   BMI 23.12 kg/m    Skin warm and dry.Pelvic: external genitalia is normal in appearance no lesions, vagina: pale with loss of moisture and rugae,urethra has no lesions or masses noted, cervix and uterus are absent, +cystocele, adnexa: no masses or tenderness noted. Bladder is non tender and no masses felt. On rectal exam has +rectocele.   Discussed kegel's and she is doing those and may want to try pesasry, showed medical explainer #4.She wants to want for now, will wear a pad. Face time 15 minutes with 50% counseling.  Assessment:     1. Cystocele with rectocele   2. Mixed stress and urge urinary incontinence   3. Current use of estrogen therapy       Plan:    Call when ready for pessary fitting  Follow up prn  Review handout on pelvic organ prolapse

## 2016-08-12 NOTE — Patient Instructions (Signed)
Pelvic Organ Prolapse Pelvic organ prolapse is the stretching, bulging, or dropping of pelvic organs into an abnormal position. It happens when the muscles and tissues that surround and support pelvic structures are stretched or weak. Pelvic organ prolapse can involve:  Vagina (vaginal prolapse).  Uterus (uterine prolapse).  Bladder (cystocele).  Rectum (rectocele).  Intestines (enterocele). When organs other than the vagina are involved, they often bulge into the vagina or protrude from the vagina, depending on how severe the prolapse is. What are the causes? Causes of this condition include:  Pregnancy, labor, and childbirth.  Long-lasting (chronic) cough.  Chronic constipation.  Obesity.  Past pelvic surgery.  Aging. During and after menopause, a decreased production of the hormone estrogen can weaken pelvic ligaments and muscles.  Consistently lifting more than 50 lb (23 kg).  Buildup of fluid in the abdomen due to certain diseases and other conditions. What are the signs or symptoms? Symptoms of this condition include:  Loss of bladder control when you cough, sneeze, strain, and exercise (stress incontinence). This may be worse immediately following childbirth, and it may gradually improve over time.  Feeling pressure in your pelvis or vagina. This pressure may increase when you cough or when you are having a bowel movement.  A bulge that protrudes from the opening of your vagina or against your vaginal wall. If your uterus protrudes through the opening of your vagina and rubs against your clothing, you may also experience soreness, ulcers, infection, pain, and bleeding.  Increased effort to have a bowel movement or urinate.  Pain in your low back.  Pain, discomfort, or disinterest in sexual intercourse.  Repeated bladder infections (urinary tract infections).  Difficulty inserting or inability to insert a tampon or applicator. In some people, this condition does  not cause any symptoms. How is this diagnosed? Your health care provider may perform an internal and external vaginal and rectal exam. During the exam, you may be asked to cough and strain while you are lying down, sitting, and standing up. Your health care provider will determine if other tests are required, such as bladder function tests. How is this treated? In most cases, this condition needs to be treated only if it produces symptoms. No treatment is guaranteed to correct the prolapse or relieve the symptoms completely. Treatment may include:  Lifestyle changes, such as:  Avoiding drinking beverages that contain caffeine.  Increasing your intake of high-fiber foods. This can help to decrease constipation and straining during bowel movements.  Emptying your bladder at scheduled times (bladder training therapy). This can help to reduce or avoid urinary incontinence.  Losing weight if you are overweight or obese.  Estrogen. Estrogen may help mild prolapse by increasing the strength and tone of pelvic floor muscles.  Kegel exercises. These may help mild cases of prolapse by strengthening and tightening the muscles of the pelvic floor.  Pessary insertion. A pessary is a soft, flexible device that is placed into your vagina by your health care provider to help support the vaginal walls and keep pelvic organs in place.  Surgery. This is often the only form of treatment for severe prolapse. Different types of surgeries are available. Follow these instructions at home:  Wear a sanitary pad or absorbent product if you have urinary incontinence.  Avoid heavy lifting and straining with exercise and work. Do not hold your breath when you perform mild to moderate lifting and exercise activities. Limit your activities as directed by your health care provider.  Take medicines   only as directed by your health care provider.  Perform Kegel exercises as directed by your health care provider.  If  you have a pessary, take care of it as directed by your health care provider. Contact a health care provider if:  Your symptoms interfere with your daily activities or sex life.  You need medicine to help with the discomfort.  You notice bleeding from the vagina that is not related to your period.  You have a fever.  You have pain or bleeding when you urinate.  You have bleeding when you have a bowel movement.  You lose urine when you have sex.  You have chronic constipation.  You have a pessary that falls out.  You have vaginal discharge that has a bad smell.  You have low abdominal pain or cramping that is unusual for you. This information is not intended to replace advice given to you by your health care provider. Make sure you discuss any questions you have with your health care provider. Document Released: 12/27/2013 Document Revised: 11/07/2015 Document Reviewed: 08/14/2013 Elsevier Interactive Patient Education  2017 Elsevier Inc.  

## 2016-08-14 ENCOUNTER — Other Ambulatory Visit: Payer: Self-pay | Admitting: Adult Health

## 2016-08-19 DIAGNOSIS — J069 Acute upper respiratory infection, unspecified: Secondary | ICD-10-CM | POA: Diagnosis not present

## 2016-08-19 DIAGNOSIS — R091 Pleurisy: Secondary | ICD-10-CM | POA: Diagnosis not present

## 2016-08-26 DIAGNOSIS — J329 Chronic sinusitis, unspecified: Secondary | ICD-10-CM | POA: Diagnosis not present

## 2016-11-27 DIAGNOSIS — E039 Hypothyroidism, unspecified: Secondary | ICD-10-CM | POA: Diagnosis not present

## 2016-11-27 DIAGNOSIS — I10 Essential (primary) hypertension: Secondary | ICD-10-CM | POA: Diagnosis not present

## 2016-11-27 DIAGNOSIS — E559 Vitamin D deficiency, unspecified: Secondary | ICD-10-CM | POA: Diagnosis not present

## 2016-11-30 DIAGNOSIS — Z0000 Encounter for general adult medical examination without abnormal findings: Secondary | ICD-10-CM | POA: Diagnosis not present

## 2016-11-30 DIAGNOSIS — E039 Hypothyroidism, unspecified: Secondary | ICD-10-CM | POA: Diagnosis not present

## 2016-11-30 DIAGNOSIS — E875 Hyperkalemia: Secondary | ICD-10-CM | POA: Diagnosis not present

## 2016-11-30 DIAGNOSIS — I10 Essential (primary) hypertension: Secondary | ICD-10-CM | POA: Diagnosis not present

## 2016-11-30 DIAGNOSIS — Z6822 Body mass index (BMI) 22.0-22.9, adult: Secondary | ICD-10-CM | POA: Diagnosis not present

## 2016-12-21 DIAGNOSIS — E039 Hypothyroidism, unspecified: Secondary | ICD-10-CM | POA: Diagnosis not present

## 2016-12-21 DIAGNOSIS — I10 Essential (primary) hypertension: Secondary | ICD-10-CM | POA: Diagnosis not present

## 2017-01-15 DIAGNOSIS — M1811 Unilateral primary osteoarthritis of first carpometacarpal joint, right hand: Secondary | ICD-10-CM | POA: Diagnosis not present

## 2017-03-15 DIAGNOSIS — Z23 Encounter for immunization: Secondary | ICD-10-CM | POA: Diagnosis not present

## 2017-03-15 DIAGNOSIS — M1811 Unilateral primary osteoarthritis of first carpometacarpal joint, right hand: Secondary | ICD-10-CM | POA: Diagnosis not present

## 2017-03-31 ENCOUNTER — Other Ambulatory Visit: Payer: Self-pay | Admitting: Adult Health

## 2017-04-02 DIAGNOSIS — I10 Essential (primary) hypertension: Secondary | ICD-10-CM | POA: Diagnosis not present

## 2017-04-02 DIAGNOSIS — Z90710 Acquired absence of both cervix and uterus: Secondary | ICD-10-CM | POA: Diagnosis not present

## 2017-04-02 DIAGNOSIS — G5601 Carpal tunnel syndrome, right upper limb: Secondary | ICD-10-CM | POA: Diagnosis not present

## 2017-04-02 DIAGNOSIS — Z885 Allergy status to narcotic agent status: Secondary | ICD-10-CM | POA: Diagnosis not present

## 2017-04-02 DIAGNOSIS — M19041 Primary osteoarthritis, right hand: Secondary | ICD-10-CM | POA: Diagnosis not present

## 2017-04-02 DIAGNOSIS — M1811 Unilateral primary osteoarthritis of first carpometacarpal joint, right hand: Secondary | ICD-10-CM | POA: Diagnosis not present

## 2017-04-02 DIAGNOSIS — E039 Hypothyroidism, unspecified: Secondary | ICD-10-CM | POA: Diagnosis not present

## 2017-04-02 DIAGNOSIS — Z87891 Personal history of nicotine dependence: Secondary | ICD-10-CM | POA: Diagnosis not present

## 2017-04-09 DIAGNOSIS — M25641 Stiffness of right hand, not elsewhere classified: Secondary | ICD-10-CM | POA: Diagnosis not present

## 2017-04-09 DIAGNOSIS — M6281 Muscle weakness (generalized): Secondary | ICD-10-CM | POA: Diagnosis not present

## 2017-04-09 DIAGNOSIS — M1811 Unilateral primary osteoarthritis of first carpometacarpal joint, right hand: Secondary | ICD-10-CM | POA: Diagnosis not present

## 2017-04-09 DIAGNOSIS — M25541 Pain in joints of right hand: Secondary | ICD-10-CM | POA: Diagnosis not present

## 2017-04-14 DIAGNOSIS — M25541 Pain in joints of right hand: Secondary | ICD-10-CM | POA: Diagnosis not present

## 2017-04-14 DIAGNOSIS — M6281 Muscle weakness (generalized): Secondary | ICD-10-CM | POA: Diagnosis not present

## 2017-04-14 DIAGNOSIS — M25641 Stiffness of right hand, not elsewhere classified: Secondary | ICD-10-CM | POA: Diagnosis not present

## 2017-04-14 DIAGNOSIS — M1811 Unilateral primary osteoarthritis of first carpometacarpal joint, right hand: Secondary | ICD-10-CM | POA: Diagnosis not present

## 2017-04-21 DIAGNOSIS — M25641 Stiffness of right hand, not elsewhere classified: Secondary | ICD-10-CM | POA: Diagnosis not present

## 2017-04-21 DIAGNOSIS — M25541 Pain in joints of right hand: Secondary | ICD-10-CM | POA: Diagnosis not present

## 2017-04-21 DIAGNOSIS — M1811 Unilateral primary osteoarthritis of first carpometacarpal joint, right hand: Secondary | ICD-10-CM | POA: Diagnosis not present

## 2017-04-21 DIAGNOSIS — M6281 Muscle weakness (generalized): Secondary | ICD-10-CM | POA: Diagnosis not present

## 2017-04-29 DIAGNOSIS — M6281 Muscle weakness (generalized): Secondary | ICD-10-CM | POA: Diagnosis not present

## 2017-04-29 DIAGNOSIS — M1811 Unilateral primary osteoarthritis of first carpometacarpal joint, right hand: Secondary | ICD-10-CM | POA: Diagnosis not present

## 2017-04-29 DIAGNOSIS — M25541 Pain in joints of right hand: Secondary | ICD-10-CM | POA: Diagnosis not present

## 2017-04-29 DIAGNOSIS — M25641 Stiffness of right hand, not elsewhere classified: Secondary | ICD-10-CM | POA: Diagnosis not present

## 2017-05-04 DIAGNOSIS — M1811 Unilateral primary osteoarthritis of first carpometacarpal joint, right hand: Secondary | ICD-10-CM | POA: Diagnosis not present

## 2017-05-04 DIAGNOSIS — M25641 Stiffness of right hand, not elsewhere classified: Secondary | ICD-10-CM | POA: Diagnosis not present

## 2017-05-04 DIAGNOSIS — M6281 Muscle weakness (generalized): Secondary | ICD-10-CM | POA: Diagnosis not present

## 2017-05-04 DIAGNOSIS — M25541 Pain in joints of right hand: Secondary | ICD-10-CM | POA: Diagnosis not present

## 2017-05-13 DIAGNOSIS — M6281 Muscle weakness (generalized): Secondary | ICD-10-CM | POA: Diagnosis not present

## 2017-05-13 DIAGNOSIS — M25541 Pain in joints of right hand: Secondary | ICD-10-CM | POA: Diagnosis not present

## 2017-05-13 DIAGNOSIS — M1811 Unilateral primary osteoarthritis of first carpometacarpal joint, right hand: Secondary | ICD-10-CM | POA: Diagnosis not present

## 2017-05-13 DIAGNOSIS — M25641 Stiffness of right hand, not elsewhere classified: Secondary | ICD-10-CM | POA: Diagnosis not present

## 2017-05-19 DIAGNOSIS — M1811 Unilateral primary osteoarthritis of first carpometacarpal joint, right hand: Secondary | ICD-10-CM | POA: Diagnosis not present

## 2017-05-19 DIAGNOSIS — M25541 Pain in joints of right hand: Secondary | ICD-10-CM | POA: Diagnosis not present

## 2017-05-19 DIAGNOSIS — M25641 Stiffness of right hand, not elsewhere classified: Secondary | ICD-10-CM | POA: Diagnosis not present

## 2017-05-19 DIAGNOSIS — M6281 Muscle weakness (generalized): Secondary | ICD-10-CM | POA: Diagnosis not present

## 2017-06-01 DIAGNOSIS — M25641 Stiffness of right hand, not elsewhere classified: Secondary | ICD-10-CM | POA: Diagnosis not present

## 2017-06-01 DIAGNOSIS — M1811 Unilateral primary osteoarthritis of first carpometacarpal joint, right hand: Secondary | ICD-10-CM | POA: Diagnosis not present

## 2017-06-01 DIAGNOSIS — M6281 Muscle weakness (generalized): Secondary | ICD-10-CM | POA: Diagnosis not present

## 2017-06-01 DIAGNOSIS — M25541 Pain in joints of right hand: Secondary | ICD-10-CM | POA: Diagnosis not present

## 2017-06-02 DIAGNOSIS — D225 Melanocytic nevi of trunk: Secondary | ICD-10-CM | POA: Diagnosis not present

## 2017-06-02 DIAGNOSIS — D485 Neoplasm of uncertain behavior of skin: Secondary | ICD-10-CM | POA: Diagnosis not present

## 2017-06-02 DIAGNOSIS — L821 Other seborrheic keratosis: Secondary | ICD-10-CM | POA: Diagnosis not present

## 2017-06-14 DIAGNOSIS — Z9849 Cataract extraction status, unspecified eye: Secondary | ICD-10-CM | POA: Diagnosis not present

## 2017-06-14 DIAGNOSIS — H52223 Regular astigmatism, bilateral: Secondary | ICD-10-CM | POA: Diagnosis not present

## 2017-06-14 DIAGNOSIS — H353131 Nonexudative age-related macular degeneration, bilateral, early dry stage: Secondary | ICD-10-CM | POA: Diagnosis not present

## 2017-06-14 DIAGNOSIS — H5212 Myopia, left eye: Secondary | ICD-10-CM | POA: Diagnosis not present

## 2017-07-05 ENCOUNTER — Other Ambulatory Visit: Payer: Self-pay | Admitting: Adult Health

## 2017-10-13 ENCOUNTER — Other Ambulatory Visit: Payer: Self-pay | Admitting: Adult Health

## 2017-10-22 DIAGNOSIS — E559 Vitamin D deficiency, unspecified: Secondary | ICD-10-CM | POA: Diagnosis not present

## 2017-10-22 DIAGNOSIS — E039 Hypothyroidism, unspecified: Secondary | ICD-10-CM | POA: Diagnosis not present

## 2017-10-22 DIAGNOSIS — E782 Mixed hyperlipidemia: Secondary | ICD-10-CM | POA: Diagnosis not present

## 2017-10-28 DIAGNOSIS — I10 Essential (primary) hypertension: Secondary | ICD-10-CM | POA: Diagnosis not present

## 2017-10-28 DIAGNOSIS — Z6823 Body mass index (BMI) 23.0-23.9, adult: Secondary | ICD-10-CM | POA: Diagnosis not present

## 2017-10-28 DIAGNOSIS — E875 Hyperkalemia: Secondary | ICD-10-CM | POA: Diagnosis not present

## 2017-10-28 DIAGNOSIS — E039 Hypothyroidism, unspecified: Secondary | ICD-10-CM | POA: Diagnosis not present

## 2017-12-08 DIAGNOSIS — E039 Hypothyroidism, unspecified: Secondary | ICD-10-CM | POA: Diagnosis not present

## 2018-01-05 DIAGNOSIS — Z6823 Body mass index (BMI) 23.0-23.9, adult: Secondary | ICD-10-CM | POA: Diagnosis not present

## 2018-01-05 DIAGNOSIS — E039 Hypothyroidism, unspecified: Secondary | ICD-10-CM | POA: Diagnosis not present

## 2018-01-05 DIAGNOSIS — I10 Essential (primary) hypertension: Secondary | ICD-10-CM | POA: Diagnosis not present

## 2018-01-05 DIAGNOSIS — E875 Hyperkalemia: Secondary | ICD-10-CM | POA: Diagnosis not present

## 2018-01-12 ENCOUNTER — Other Ambulatory Visit: Payer: Self-pay

## 2018-01-20 DIAGNOSIS — T1512XA Foreign body in conjunctival sac, left eye, initial encounter: Secondary | ICD-10-CM | POA: Diagnosis not present

## 2018-03-18 DIAGNOSIS — J029 Acute pharyngitis, unspecified: Secondary | ICD-10-CM | POA: Diagnosis not present

## 2018-03-18 DIAGNOSIS — J209 Acute bronchitis, unspecified: Secondary | ICD-10-CM | POA: Diagnosis not present

## 2018-03-18 DIAGNOSIS — J069 Acute upper respiratory infection, unspecified: Secondary | ICD-10-CM | POA: Diagnosis not present

## 2018-03-29 ENCOUNTER — Ambulatory Visit (INDEPENDENT_AMBULATORY_CARE_PROVIDER_SITE_OTHER): Payer: Medicare Other

## 2018-03-29 ENCOUNTER — Ambulatory Visit (INDEPENDENT_AMBULATORY_CARE_PROVIDER_SITE_OTHER): Payer: Medicare Other | Admitting: Orthopaedic Surgery

## 2018-03-29 ENCOUNTER — Encounter: Payer: Self-pay | Admitting: Orthopaedic Surgery

## 2018-03-29 VITALS — BP 133/79 | HR 72 | Ht 63.5 in | Wt 131.0 lb

## 2018-03-29 DIAGNOSIS — M25521 Pain in right elbow: Secondary | ICD-10-CM

## 2018-03-29 NOTE — Progress Notes (Signed)
Subjective:    Patient ID: Tiffany Kim, female    DOB: 08-May-1941, 77 y.o.   MRN: [id number]  HPI She is seen for the first time in over three years. She has right elbow pain that began about a month ago. It only hurts on the tip of the right olecranon area. She has no trauma,no redness, no swelling.  She has full motion.  She is tired of it hurting when she rests her arm on something.  Nothing seems to help.   Review of Systems  Constitutional: Positive for activity change.  Musculoskeletal: Positive for arthralgias.  All other systems reviewed and are negative.  For Review of Systems, all other systems reviewed and are negative.  The following is a summary of the past history medically, past history surgically, known current medicines, social history and family history.  This information is gathered electronically by the computer from prior information and documentation.  I review this each visit and have found including this information at this point in the chart is beneficial and informative.   Past Medical History:  Diagnosis Date  . Current use of estrogen therapy 08/22/2014  . Dizziness 08/22/2014  . Endometriosis   . Hypertension 08/22/2014  . Hypothyroidism   . IBS (irritable bowel syndrome)   . Vitamin D deficiency     Past Surgical History:  Procedure Laterality Date  . ABDOMINAL HYSTERECTOMY    . BREAST BIOPSY    . BUNIONECTOMY  10/22/2011   Procedure: Lillard Anes;  Surgeon: Marcheta Grammes, DPM;  Location: [facility];  Service: Orthopedics;  Laterality: Right;  Austin Bunionectomy Right Foot  . CHOLECYSTECTOMY    . FOOT SURGERY    . HAMMER TOE SURGERY  10/22/2011   Procedure: HAMMER TOE CORRECTION;  Surgeon: Marcheta Grammes, DPM;  Location: [facility];  Service: Orthopedics;  Laterality: Right;  Arthroplasty 5th Toe Right Foot  . LAPAROSCOPIC APPENDECTOMY N/A 12/22/2013   Procedure: APPENDECTOMY LAPAROSCOPIC;  Surgeon: Jamesetta So, MD;  Location: [facility];   Service: General;  Laterality: N/A;  . METATARSAL OSTEOTOMY  10/22/2011   Procedure: METATARSAL OSTEOTOMY;  Surgeon: Marcheta Grammes, DPM;  Location: [facility];  Service: Orthopedics;  Laterality: Right;  Possible Aiken Osteotomy Right Foot  . THYROIDECTOMY      Current Outpatient Medications on File Prior to Visit  Medication Sig Dispense Refill  . B Complex-C (SUPER B COMPLEX PO) Take by mouth daily.    . Cholecalciferol (VITAMIN D PO) Take 4,000 Units by mouth daily.    Marland Kitchen estradiol (CLIMARA - DOSED IN MG/24 HR) 0.05 mg/24hr patch APPLY ONE PATCH WEEKLY. 4 patch 6  . hydrochlorothiazide (HYDRODIURIL) 25 MG tablet TAKE 1/2 TO 1 TABLET DAILY AS NEEDED FOR FLUID. 30 tablet 6  . losartan (COZAAR) 50 MG tablet Take 50 mg by mouth daily.     . Multiple Vitamins-Minerals (PRESERVISION AREDS PO) Take by mouth. 1 BID    . Omega-3 Fatty Acids (FISH OIL) 500 MG CAPS Take by mouth daily.    . Psyllium (METAMUCIL PO) Take by mouth. Takes 2 daily.    Marland Kitchen SYNTHROID 112 MCG tablet Take 112 mcg by mouth daily.      No current facility-administered medications on file prior to visit.     Social History   Socioeconomic History  . Marital status: Married    Spouse name: Not on file  . Number of children: Not on file  . Years of education: Not on file  .  Highest education level: Not on file  Occupational History  . Not on file  Social Needs  . Financial resource strain: Not on file  . Food insecurity:    Worry: Not on file    Inability: Not on file  . Transportation needs:    Medical: Not on file    Non-medical: Not on file  Tobacco Use  . Smoking status: Former Smoker    Types: Cigarettes  . Smokeless tobacco: Never Used  Substance and Sexual Activity  . Alcohol use: Yes    Alcohol/week: 2.0 standard drinks    Types: 2 Glasses of wine per week    Comment: wine every night  . Drug use: No  . Sexual activity: Not Currently    Birth control/protection: Surgical    Comment: hyst    Lifestyle  . Physical activity:    Days per week: Not on file    Minutes per session: Not on file  . Stress: Not on file  Relationships  . Social connections:    Talks on phone: Not on file    Gets together: Not on file    Attends religious service: Not on file    Active member of club or organization: Not on file    Attends meetings of clubs or organizations: Not on file    Relationship status: Not on file  . Intimate partner violence:    Fear of current or ex partner: Not on file    Emotionally abused: Not on file    Physically abused: Not on file    Forced sexual activity: Not on file  Other Topics Concern  . Not on file  Social History Narrative  . Not on file    Family History  Problem Relation Age of Onset  . Heart attack Father        Age 7  . Heart attack Mother        Late 59s  . Cancer Mother        colon  . COPD Sister   . Hypertension Brother   . Breast cancer Sister   . Cancer Maternal Grandfather        pancreatic  . Stroke Paternal Grandmother     BP 133/79   Pulse 72   Ht 5' 3.5" (1.613 m)   Wt 131 lb (59.4 kg)   BMI 22.84 kg/m   Body mass index is 22.84 kg/m.     Objective:   Physical Exam  Constitutional: She is oriented to person, place, and time. She appears well-developed and well-nourished.  HENT:  Head: Normocephalic and atraumatic.  Eyes: Pupils are equal, round, and reactive to light. Conjunctivae and EOM are normal.  Neck: Normal range of motion. Neck supple.  Cardiovascular: Normal rate, regular rhythm and intact distal pulses.  Pulmonary/Chest: Effort normal.  Abdominal: Soft.  Musculoskeletal:       Arms: Neurological: She is alert and oriented to person, place, and time. She has normal reflexes. She displays normal reflexes. No cranial nerve deficit. She exhibits normal muscle tone. Coordination normal.  Skin: Skin is warm and dry.  Psychiatric: She has a normal mood and affect. Her behavior is normal. Judgment and  thought content normal.     X-rays were done of the right elbow, reported separately.  Negative.     Assessment & Plan:   Encounter Diagnosis  Name Primary?  . Right elbow pain Yes   I have recommended ice massage.  I have recommended use of Aspercreme  or BioFreeze to the area.  She may need injection.  To call and I will see as needed.  I have also recommended consideration of a skate boarders elbow pads.  Call if any problem.  Precautions discussed.   Electronically Signed Sanjuana Kava, MD 10/15/20193:04 PM

## 2018-04-13 DIAGNOSIS — Z23 Encounter for immunization: Secondary | ICD-10-CM | POA: Diagnosis not present

## 2018-04-28 ENCOUNTER — Other Ambulatory Visit: Payer: Self-pay | Admitting: Adult Health

## 2018-05-10 DIAGNOSIS — I10 Essential (primary) hypertension: Secondary | ICD-10-CM | POA: Diagnosis not present

## 2018-05-10 DIAGNOSIS — E039 Hypothyroidism, unspecified: Secondary | ICD-10-CM | POA: Diagnosis not present

## 2018-05-10 DIAGNOSIS — Z0000 Encounter for general adult medical examination without abnormal findings: Secondary | ICD-10-CM | POA: Diagnosis not present

## 2018-05-16 DIAGNOSIS — E039 Hypothyroidism, unspecified: Secondary | ICD-10-CM | POA: Diagnosis not present

## 2018-05-16 DIAGNOSIS — Z23 Encounter for immunization: Secondary | ICD-10-CM | POA: Diagnosis not present

## 2018-05-16 DIAGNOSIS — I10 Essential (primary) hypertension: Secondary | ICD-10-CM | POA: Diagnosis not present

## 2018-05-16 DIAGNOSIS — R011 Cardiac murmur, unspecified: Secondary | ICD-10-CM | POA: Diagnosis not present

## 2018-05-16 DIAGNOSIS — Z6823 Body mass index (BMI) 23.0-23.9, adult: Secondary | ICD-10-CM | POA: Diagnosis not present

## 2018-05-16 DIAGNOSIS — J45990 Exercise induced bronchospasm: Secondary | ICD-10-CM | POA: Diagnosis not present

## 2018-05-16 DIAGNOSIS — J45998 Other asthma: Secondary | ICD-10-CM | POA: Diagnosis not present

## 2018-05-26 DIAGNOSIS — H353132 Nonexudative age-related macular degeneration, bilateral, intermediate dry stage: Secondary | ICD-10-CM | POA: Diagnosis not present

## 2018-06-20 DIAGNOSIS — E039 Hypothyroidism, unspecified: Secondary | ICD-10-CM | POA: Diagnosis not present

## 2018-07-04 ENCOUNTER — Other Ambulatory Visit: Payer: Self-pay | Admitting: Adult Health

## 2018-07-04 ENCOUNTER — Other Ambulatory Visit: Payer: Self-pay | Admitting: Women's Health

## 2018-07-11 DIAGNOSIS — J019 Acute sinusitis, unspecified: Secondary | ICD-10-CM | POA: Diagnosis not present

## 2018-07-29 ENCOUNTER — Other Ambulatory Visit: Payer: Self-pay | Admitting: Adult Health

## 2018-11-16 DIAGNOSIS — L308 Other specified dermatitis: Secondary | ICD-10-CM | POA: Diagnosis not present

## 2018-11-16 DIAGNOSIS — L820 Inflamed seborrheic keratosis: Secondary | ICD-10-CM | POA: Diagnosis not present

## 2018-11-16 DIAGNOSIS — X32XXXD Exposure to sunlight, subsequent encounter: Secondary | ICD-10-CM | POA: Diagnosis not present

## 2018-11-16 DIAGNOSIS — L570 Actinic keratosis: Secondary | ICD-10-CM | POA: Diagnosis not present

## 2018-11-16 DIAGNOSIS — L218 Other seborrheic dermatitis: Secondary | ICD-10-CM | POA: Diagnosis not present

## 2018-11-24 DIAGNOSIS — E039 Hypothyroidism, unspecified: Secondary | ICD-10-CM | POA: Diagnosis not present

## 2018-11-28 DIAGNOSIS — Z961 Presence of intraocular lens: Secondary | ICD-10-CM | POA: Diagnosis not present

## 2018-11-28 DIAGNOSIS — H43813 Vitreous degeneration, bilateral: Secondary | ICD-10-CM | POA: Diagnosis not present

## 2018-11-28 DIAGNOSIS — H26492 Other secondary cataract, left eye: Secondary | ICD-10-CM | POA: Diagnosis not present

## 2018-11-28 DIAGNOSIS — H353132 Nonexudative age-related macular degeneration, bilateral, intermediate dry stage: Secondary | ICD-10-CM | POA: Diagnosis not present

## 2018-11-29 DIAGNOSIS — Z1321 Encounter for screening for nutritional disorder: Secondary | ICD-10-CM | POA: Diagnosis not present

## 2018-11-29 DIAGNOSIS — E039 Hypothyroidism, unspecified: Secondary | ICD-10-CM | POA: Diagnosis not present

## 2018-11-29 DIAGNOSIS — I10 Essential (primary) hypertension: Secondary | ICD-10-CM | POA: Diagnosis not present

## 2018-11-29 DIAGNOSIS — Z1329 Encounter for screening for other suspected endocrine disorder: Secondary | ICD-10-CM | POA: Diagnosis not present

## 2018-12-06 DIAGNOSIS — J4520 Mild intermittent asthma, uncomplicated: Secondary | ICD-10-CM | POA: Diagnosis not present

## 2018-12-06 DIAGNOSIS — I10 Essential (primary) hypertension: Secondary | ICD-10-CM | POA: Diagnosis not present

## 2018-12-06 DIAGNOSIS — E039 Hypothyroidism, unspecified: Secondary | ICD-10-CM | POA: Diagnosis not present

## 2018-12-06 DIAGNOSIS — Z0001 Encounter for general adult medical examination with abnormal findings: Secondary | ICD-10-CM | POA: Diagnosis not present

## 2018-12-06 DIAGNOSIS — R011 Cardiac murmur, unspecified: Secondary | ICD-10-CM | POA: Diagnosis not present

## 2018-12-06 DIAGNOSIS — N951 Menopausal and female climacteric states: Secondary | ICD-10-CM | POA: Diagnosis not present

## 2018-12-06 DIAGNOSIS — R0602 Shortness of breath: Secondary | ICD-10-CM | POA: Diagnosis not present

## 2018-12-20 ENCOUNTER — Other Ambulatory Visit: Payer: Self-pay | Admitting: Adult Health

## 2019-01-16 ENCOUNTER — Other Ambulatory Visit: Payer: Self-pay

## 2019-01-19 ENCOUNTER — Ambulatory Visit (INDEPENDENT_AMBULATORY_CARE_PROVIDER_SITE_OTHER): Payer: Medicare Other | Admitting: Orthopaedic Surgery

## 2019-01-19 ENCOUNTER — Other Ambulatory Visit: Payer: Self-pay | Admitting: Orthopaedic Surgery

## 2019-01-19 ENCOUNTER — Ambulatory Visit: Payer: Medicare Other

## 2019-01-19 ENCOUNTER — Encounter: Payer: Self-pay | Admitting: Orthopaedic Surgery

## 2019-01-19 ENCOUNTER — Other Ambulatory Visit: Payer: Self-pay

## 2019-01-19 VITALS — BP 127/68 | HR 58 | Temp 97.2°F | Ht 63.5 in | Wt 130.0 lb

## 2019-01-19 DIAGNOSIS — G8929 Other chronic pain: Secondary | ICD-10-CM | POA: Diagnosis not present

## 2019-01-19 DIAGNOSIS — M25512 Pain in left shoulder: Secondary | ICD-10-CM

## 2019-01-19 DIAGNOSIS — M25511 Pain in right shoulder: Secondary | ICD-10-CM

## 2019-01-19 NOTE — Progress Notes (Signed)
Patient Tiffany Kim, female DOB:[birth year], 78 y.o. GLO:[id number]  Chief Complaint  Patient presents with  . Shoulder Pain    pain left shoulder to elbow     HPI  Tiffany Kim is a 78 y.o. female who has developed pain and tenderness of the left shoulder. She has no trauma, no swelling, no redness.  It hurts to go overhead or with abduction. She plays tennis very often.  She has used Voltaren Gel and it helps. The shoulder is not getting any better.   Body mass index is 22.67 kg/m.  ROS  Review of Systems  Constitutional: Positive for activity change.  Musculoskeletal: Positive for arthralgias.  All other systems reviewed and are negative.   All other systems reviewed and are negative.  The following is a summary of the past history medically, past history surgically, known current medicines, social history and family history.  This information is gathered electronically by the computer from prior information and documentation.  I review this each visit and have found including this information at this point in the chart is beneficial and informative.    Past Medical History:  Diagnosis Date  . Current use of estrogen therapy 08/22/2014  . Dizziness 08/22/2014  . Endometriosis   . Hypertension 08/22/2014  . Hypothyroidism   . IBS (irritable bowel syndrome)   . Vitamin D deficiency     Past Surgical History:  Procedure Laterality Date  . ABDOMINAL HYSTERECTOMY    . BREAST BIOPSY    . BUNIONECTOMY  10/22/2011   Procedure: Lillard Anes;  Surgeon: Marcheta Grammes, DPM;  Location: [facility];  Service: Orthopedics;  Laterality: Right;  [place] Bunionectomy Right Foot  . CHOLECYSTECTOMY    . FOOT SURGERY    . HAMMER TOE SURGERY  10/22/2011   Procedure: HAMMER TOE CORRECTION;  Surgeon: Marcheta Grammes, DPM;  Location: [facility];  Service: Orthopedics;  Laterality: Right;  Arthroplasty 5th Toe Right Foot  . LAPAROSCOPIC APPENDECTOMY N/A 12/22/2013   Procedure:  APPENDECTOMY LAPAROSCOPIC;  Surgeon: Jamesetta So, MD;  Location: [facility];  Service: General;  Laterality: N/A;  . METATARSAL OSTEOTOMY  10/22/2011   Procedure: METATARSAL OSTEOTOMY;  Surgeon: Marcheta Grammes, DPM;  Location: [facility];  Service: Orthopedics;  Laterality: Right;  Possible Aiken Osteotomy Right Foot  . THYROIDECTOMY      Family History  Problem Relation Age of Onset  . Heart attack Father        Age 1  . Heart attack Mother        Late 37s  . Cancer Mother        colon  . COPD Sister   . Hypertension Brother   . Breast cancer Sister   . Cancer Maternal Grandfather        pancreatic  . Stroke Paternal Grandmother     Social History Social History   Tobacco Use  . Smoking status: Former Smoker    Types: Cigarettes  . Smokeless tobacco: Never Used  Substance Use Topics  . Alcohol use: Yes    Alcohol/week: 2.0 standard drinks    Types: 2 Glasses of wine per week    Comment: wine every night  . Drug use: No    Allergies  Allergen Reactions  . Codeine Nausea And Vomiting  . Other Nausea And Vomiting    Any pain med; anasthesia    Current Outpatient Medications  Medication Sig Dispense Refill  . B Complex-C (SUPER B COMPLEX PO) Take by mouth  daily.    . Cholecalciferol (VITAMIN D PO) Take 4,000 Units by mouth daily.    Marland Kitchen estradiol (CLIMARA - DOSED IN MG/24 HR) 0.05 mg/24hr patch APPLY ONE PATCH WEEKLY. 4 patch 6  . hydrochlorothiazide (HYDRODIURIL) 25 MG tablet TAKE 1/2 TO 1 TABLET DAILY AS NEEDED FOR FLUID. 30 tablet 6  . losartan (COZAAR) 50 MG tablet Take 50 mg by mouth daily.     . Multiple Vitamins-Minerals (PRESERVISION AREDS PO) Take by mouth. 1 BID    . Omega-3 Fatty Acids (FISH OIL) 500 MG CAPS Take by mouth daily.    Marland Kitchen SYNTHROID 112 MCG tablet Take 112 mcg by mouth daily.      No current facility-administered medications for this visit.      Physical Exam  Blood pressure 127/68, pulse (!) 58, temperature (!) 97.2 F (36.2 C), height  5' 3.5" (1.613 m), weight 130 lb (59 kg).  Constitutional: overall normal hygiene, normal nutrition, well developed, normal grooming, normal body habitus. Assistive device:none  Musculoskeletal: gait and station Limp none, muscle tone and strength are normal, no tremors or atrophy is present.  .  Neurological: coordination overall normal.  Deep tendon reflex/nerve stretch intact.  Sensation normal.  Cranial nerves II-XII intact.   Skin:   Normal overall no scars, lesions, ulcers or rashes. No psoriasis.  Psychiatric: Alert and oriented x 3.  Recent memory intact, remote memory unclear.  Normal mood and affect. Well groomed.  Good eye contact.  Cardiovascular: overall no swelling, no varicosities, no edema bilaterally, normal temperatures of the legs and arms, no clubbing, cyanosis and good capillary refill.  Examination of left Upper Extremity is done.  Inspection:   Overall:  Elbow non-tender without crepitus or defects, forearm non-tender without crepitus or defects, wrist non-tender without crepitus or defects, hand non-tender.    Shoulder: with glenohumeral joint tenderness, without effusion.   Upper arm:  without swelling and tenderness   Range of motion:   Overall:  Full range of motion of the elbow, full range of motion of wrist and full range of motion in fingers.   Shoulder:  left  160 degrees forward flexion; 100 degrees abduction; 30 degrees internal rotation, 30 degrees external rotation, 10 degrees extension, 40 degrees adduction.   Stability:   Overall:  Shoulder, elbow and wrist stable   Strength and Tone:   Overall full shoulder muscles strength, full upper arm strength and normal upper arm bulk and tone.  Lymphatic: palpation is normal.  All other systems reviewed and are negative   The patient has been educated about the nature of the problem(s) and counseled on treatment options.  The patient appeared to understand what I have discussed and is in agreement with  it.  Encounter Diagnosis  Name Primary?  . Chronic right shoulder pain Yes   PROCEDURE NOTE:  The patient request injection, verbal consent was obtained.  The left shoulder was prepped appropriately after time out was performed.   Sterile technique was observed and injection of 1 cc of Depo-Medrol 40 mg with several cc's of plain xylocaine. Anesthesia was provided by ethyl chloride and a 20-gauge needle was used to inject the shoulder area. A posterior approach was used.  The injection was tolerated well.  A band aid dressing was applied.  The patient was advised to apply ice later today and tomorrow to the injection sight as needed.    PLAN Call if any problems.  Precautions discussed.  Continue current medications. Begin Aleve one twice  a day. Continue the Gel.  Return to clinic 2 weeks   Electronically Signed Sanjuana Kava, MD 8/6/202010:40 AM

## 2019-02-02 ENCOUNTER — Ambulatory Visit (INDEPENDENT_AMBULATORY_CARE_PROVIDER_SITE_OTHER): Payer: Medicare Other | Admitting: Orthopaedic Surgery

## 2019-02-02 ENCOUNTER — Other Ambulatory Visit: Payer: Self-pay

## 2019-02-02 ENCOUNTER — Encounter: Payer: Self-pay | Admitting: Orthopaedic Surgery

## 2019-02-02 VITALS — BP 120/82 | HR 60 | Temp 93.2°F | Ht 63.5 in | Wt 131.0 lb

## 2019-02-02 DIAGNOSIS — M25512 Pain in left shoulder: Secondary | ICD-10-CM

## 2019-02-02 DIAGNOSIS — G8929 Other chronic pain: Secondary | ICD-10-CM

## 2019-02-02 NOTE — Addendum Note (Signed)
Addended by: Derek Mound A on: 02/02/2019 11:52 AM   Modules accepted: Orders

## 2019-02-02 NOTE — Progress Notes (Signed)
Patient Tiffany Kim, female DOB:[birth year], 78 y.o. OIN:[id number]  Chief Complaint  Patient presents with  . Shoulder Pain    L/still hurts    HPI  Tiffany Kim is a 78 y.o. female who continues to have pain in the left shoulder.  The injection did not help.  She has pain with overhead use and rolling on it at night. She has no numbness or swelling.  I will get MRI of the shoulder.   Body mass index is 22.84 kg/m.  ROS  Review of Systems  Constitutional: Positive for activity change.  Musculoskeletal: Positive for arthralgias.  All other systems reviewed and are negative.   All other systems reviewed and are negative.  The following is a summary of the past history medically, past history surgically, known current medicines, social history and family history.  This information is gathered electronically by the computer from prior information and documentation.  I review this each visit and have found including this information at this point in the chart is beneficial and informative.    Past Medical History:  Diagnosis Date  . Current use of estrogen therapy 08/22/2014  . Dizziness 08/22/2014  . Endometriosis   . Hypertension 08/22/2014  . Hypothyroidism   . IBS (irritable bowel syndrome)   . Vitamin D deficiency     Past Surgical History:  Procedure Laterality Date  . ABDOMINAL HYSTERECTOMY    . BREAST BIOPSY    . BUNIONECTOMY  10/22/2011   Procedure: Lillard Anes;  Surgeon: Marcheta Grammes, DPM;  Location: [facility];  Service: Orthopedics;  Laterality: Right;  [place] Bunionectomy Right Foot  . CHOLECYSTECTOMY    . FOOT SURGERY    . HAMMER TOE SURGERY  10/22/2011   Procedure: HAMMER TOE CORRECTION;  Surgeon: Marcheta Grammes, DPM;  Location: [facility];  Service: Orthopedics;  Laterality: Right;  Arthroplasty 5th Toe Right Foot  . LAPAROSCOPIC APPENDECTOMY N/A 12/22/2013   Procedure: APPENDECTOMY LAPAROSCOPIC;  Surgeon: Jamesetta So, MD;  Location: [facility];   Service: General;  Laterality: N/A;  . METATARSAL OSTEOTOMY  10/22/2011   Procedure: METATARSAL OSTEOTOMY;  Surgeon: Marcheta Grammes, DPM;  Location: [facility];  Service: Orthopedics;  Laterality: Right;  Possible Aiken Osteotomy Right Foot  . THYROIDECTOMY      Family History  Problem Relation Age of Onset  . Heart attack Father        Age 42  . Heart attack Mother        Late 2s  . Cancer Mother        colon  . COPD Sister   . Hypertension Brother   . Breast cancer Sister   . Cancer Maternal Grandfather        pancreatic  . Stroke Paternal Grandmother     Social History Social History   Tobacco Use  . Smoking status: Former Smoker    Types: Cigarettes  . Smokeless tobacco: Never Used  Substance Use Topics  . Alcohol use: Yes    Alcohol/week: 2.0 standard drinks    Types: 2 Glasses of wine per week    Comment: wine every night  . Drug use: No    Allergies  Allergen Reactions  . Codeine Nausea And Vomiting  . Other Nausea And Vomiting    Any pain med; anasthesia    Current Outpatient Medications  Medication Sig Dispense Refill  . B Complex-C (SUPER B COMPLEX PO) Take by mouth daily.    . Cholecalciferol (VITAMIN D PO) Take  4,000 Units by mouth daily.    Marland Kitchen estradiol (CLIMARA - DOSED IN MG/24 HR) 0.05 mg/24hr patch APPLY ONE PATCH WEEKLY. 4 patch 6  . hydrochlorothiazide (HYDRODIURIL) 25 MG tablet TAKE 1/2 TO 1 TABLET DAILY AS NEEDED FOR FLUID. 30 tablet 6  . losartan (COZAAR) 50 MG tablet Take 50 mg by mouth daily.     . Multiple Vitamins-Minerals (PRESERVISION AREDS PO) Take by mouth. 1 BID    . Omega-3 Fatty Acids (FISH OIL) 500 MG CAPS Take by mouth daily.    Marland Kitchen SYNTHROID 112 MCG tablet Take 112 mcg by mouth daily.      No current facility-administered medications for this visit.      Physical Exam  Blood pressure 120/82, pulse 60, temperature (!) 93.2 F (34 C), height 5' 3.5" (1.613 m), weight 131 lb (59.4 kg).  Constitutional: overall normal  hygiene, normal nutrition, well developed, normal grooming, normal body habitus. Assistive device:none  Musculoskeletal: gait and station Limp none, muscle tone and strength are normal, no tremors or atrophy is present.  .  Neurological: coordination overall normal.  Deep tendon reflex/nerve stretch intact.  Sensation normal.  Cranial nerves II-XII intact.   Skin:   Normal overall no scars, lesions, ulcers or rashes. No psoriasis.  Psychiatric: Alert and oriented x 3.  Recent memory intact, remote memory unclear.  Normal mood and affect. Well groomed.  Good eye contact.  Cardiovascular: overall no swelling, no varicosities, no edema bilaterally, normal temperatures of the legs and arms, no clubbing, cyanosis and good capillary refill.  Lymphatic: palpation is normal.  Nearly full but painful ROM of the left shoulder. NV intact. Grips normal.  All other systems reviewed and are negative   The patient has been educated about the nature of the problem(s) and counseled on treatment options.  The patient appeared to understand what I have discussed and is in agreement with it.  Encounter Diagnosis  Name Primary?  . Chronic left shoulder pain Yes    PLAN Call if any problems.  Precautions discussed.  Continue current medications.   Return to clinic 2 weeks   Get MRI of the left shoulder.  Electronically Signed Sanjuana Kava, MD 8/20/202011:31 AM

## 2019-02-02 NOTE — Addendum Note (Signed)
Addended by: Derek Mound A on: 02/02/2019 01:46 PM   Modules accepted: Orders

## 2019-02-07 ENCOUNTER — Telehealth: Payer: Self-pay | Admitting: Radiology

## 2019-02-07 ENCOUNTER — Ambulatory Visit (HOSPITAL_COMMUNITY)
Admission: RE | Admit: 2019-02-07 | Discharge: 2019-02-07 | Disposition: A | Discharge status: Home / Self Care | Payer: Medicare Other | Source: Ambulatory Visit | Attending: Orthopaedic Surgery | Admitting: Orthopaedic Surgery

## 2019-02-07 ENCOUNTER — Other Ambulatory Visit: Payer: Self-pay

## 2019-02-07 DIAGNOSIS — M25512 Pain in left shoulder: Secondary | ICD-10-CM | POA: Insufficient documentation

## 2019-02-07 DIAGNOSIS — G8929 Other chronic pain: Secondary | ICD-10-CM | POA: Diagnosis not present

## 2019-02-07 DIAGNOSIS — S46012A Strain of muscle(s) and tendon(s) of the rotator cuff of left shoulder, initial encounter: Secondary | ICD-10-CM | POA: Diagnosis not present

## 2019-02-07 NOTE — Telephone Encounter (Signed)
-----   Message from Carole Civil, MD sent at 02/07/2019  1:01 PM EDT ----- Regarding: RE: need esign pls YES ----- Message ----- From: Candice Camp, RT Sent: 02/07/2019  12:52 PM EDT To: Carole Civil, MD Subject: RE: need esign pls                             Can we put in order for Dr Briscoe Burns patient under your name, and have you sign? It did not get ordered until he was gone from the office ----- Message ----- From: Sheldon Silvan, April H Sent: 02/07/2019  12:14 PM EDT To: Candice Camp, RT Subject: RE: need esign pls                             Any way you can page him to esign it? If not will have to rs.  ----- Message ----- From: Candice Camp, RT Sent: 02/07/2019  12:09 PM EDT To: April H Pait Subject: RE: need esign pls                             You know we are in [place], no PA. Dr Aline Brochure is in surgery today.  ----- Message ----- From: Sheldon Silvan, April H Sent: 02/07/2019  12:06 PM EDT To: Candice Camp, RT Subject: RE: need esign pls                             Is there a pa or another md that can sign? ----- Message ----- From: Candice Camp, RT Sent: 02/07/2019  12:02 PM EDT To: April H Pait Subject: RE: need esign pls                             Dr Luna Glasgow is out of town this week.  ----- Message ----- From: Sheldon Silvan, April H Sent: 02/07/2019  11:48 AM EDT To: Sanjuana Kava, MD, Lanyla Costello Viona Gilmore, RT Subject: need esign pls                                 Hello, please esign this order asap, appt is today at 4pm, thanks.

## 2019-02-09 ENCOUNTER — Telehealth: Payer: Self-pay | Admitting: Radiology

## 2019-02-09 ENCOUNTER — Telehealth: Payer: Self-pay | Admitting: Orthopedic Surgery

## 2019-02-09 DIAGNOSIS — G8929 Other chronic pain: Secondary | ICD-10-CM

## 2019-02-09 NOTE — Telephone Encounter (Signed)
-----   Message from Carole Civil, MD sent at 02/09/2019 11:19 AM EDT ----- Physical therapy at [facility] 3 times a week 4 weeks  Left shoulder pain partial left rotator cuff tear

## 2019-02-09 NOTE — Telephone Encounter (Signed)
I talked to Tiffany Kim today gave her the results of her MRI.  I recommended that she have physical therapy for 4 weeks and then if no improvement then reconsider arthroscopic evaluation of this cuff to see if repair or debridement is needed  FINDINGS: MRI left shoulder Rotator cuff: There is a small superficial partial-thickness bursal surface tear of the distal supraspinatus tendon. There is a focal area of tendinopathy or superficial articular surface tear of the distal infraspinatus tendon. The subscapularis and teres minor tendons are normal.   Muscles: No atrophy or abnormal signal of the muscles of the rotator cuff.   Biceps long head:  Properly located and intact.   Acromioclavicular Joint: Minimal AC joint arthropathy. Type 2 acromion. No bursitis.   Glenohumeral Joint: Normal.   Labrum:  Normal.   Bones:  Normal.   Other: None   IMPRESSION: Tiny focal superficial tears of the infraspinatus and supraspinatus tendons as described. Otherwise, essentially normal exam.     Electronically Signed   By: Lorriane Shire M.D.   On: 02/08/2019 09:25

## 2019-02-14 DIAGNOSIS — M6281 Muscle weakness (generalized): Secondary | ICD-10-CM | POA: Diagnosis not present

## 2019-02-14 DIAGNOSIS — M25612 Stiffness of left shoulder, not elsewhere classified: Secondary | ICD-10-CM | POA: Diagnosis not present

## 2019-02-14 DIAGNOSIS — M799 Soft tissue disorder, unspecified: Secondary | ICD-10-CM | POA: Diagnosis not present

## 2019-02-14 DIAGNOSIS — M25512 Pain in left shoulder: Secondary | ICD-10-CM | POA: Diagnosis not present

## 2019-02-15 ENCOUNTER — Telehealth: Payer: Self-pay | Admitting: Orthopedic Surgery

## 2019-02-16 DIAGNOSIS — M25512 Pain in left shoulder: Secondary | ICD-10-CM | POA: Diagnosis not present

## 2019-02-16 DIAGNOSIS — M25612 Stiffness of left shoulder, not elsewhere classified: Secondary | ICD-10-CM | POA: Diagnosis not present

## 2019-02-16 DIAGNOSIS — M799 Soft tissue disorder, unspecified: Secondary | ICD-10-CM | POA: Diagnosis not present

## 2019-02-16 DIAGNOSIS — M6281 Muscle weakness (generalized): Secondary | ICD-10-CM | POA: Diagnosis not present

## 2019-02-21 ENCOUNTER — Ambulatory Visit: Payer: Medicare Other | Admitting: Orthopaedic Surgery

## 2019-02-21 DIAGNOSIS — S2232XA Fracture of one rib, left side, initial encounter for closed fracture: Secondary | ICD-10-CM | POA: Diagnosis not present

## 2019-03-03 ENCOUNTER — Ambulatory Visit (INDEPENDENT_AMBULATORY_CARE_PROVIDER_SITE_OTHER): Payer: Medicare Other | Admitting: Cardiology

## 2019-03-03 ENCOUNTER — Encounter: Payer: Self-pay | Admitting: Cardiology

## 2019-03-03 ENCOUNTER — Other Ambulatory Visit: Payer: Self-pay

## 2019-03-03 VITALS — BP 121/72 | HR 69 | Temp 96.6°F | Ht 63.0 in | Wt 134.0 lb

## 2019-03-03 DIAGNOSIS — R0989 Other specified symptoms and signs involving the circulatory and respiratory systems: Secondary | ICD-10-CM

## 2019-03-03 DIAGNOSIS — R011 Cardiac murmur, unspecified: Secondary | ICD-10-CM | POA: Diagnosis not present

## 2019-03-03 DIAGNOSIS — R0602 Shortness of breath: Secondary | ICD-10-CM | POA: Diagnosis not present

## 2019-03-03 NOTE — Patient Instructions (Signed)
Medication Instructions:  Your physician recommends that you continue on your current medications as directed. Please refer to the Current Medication list given to you today.   Labwork: none  Testing/Procedures: Your physician has requested that you have an echocardiogram. Echocardiography is a painless test that uses sound waves to create images of your heart. It provides your doctor with information about the size and shape of your heart and how well your heart's chambers and valves are working. This procedure takes approximately one hour. There are no restrictions for this procedure.  Your physician has requested that you have a carotid duplex. This test is an ultrasound of the carotid arteries in your neck. It looks at blood flow through these arteries that supply the brain with blood. Allow one hour for this exam. There are no restrictions or special instructions.    Follow-Up: Your physician recommends that you schedule a follow-up appointment in: pending test results    Any Other Special Instructions Will Be Listed Below (If Applicable).     If you need a refill on your cardiac medications before your next appointment, please call your pharmacy.

## 2019-03-03 NOTE — Progress Notes (Signed)
Clinical Summary Ms. Dalman is a 78 y.o.female seen as new consult, referred by Dr Nevada Crane for heart murmur  1. Heart murmur - very long history per her report, she thinks diagnosed in her late 54s - some recent SOB starting a few months ago - can walk 1.5 to 2 miles without troubles, though some SOB just when she starts but improves. No recent edema - no cough or wheezing   2. Chest pain - discomfort nonspecific left sided, 3/10 in severity. Can occur at any time. No other associated symptoms. Lasts just a few seconds. Sporadic in frequency.     Past Medical History:  Diagnosis Date  . Current use of estrogen therapy 08/22/2014  . Dizziness 08/22/2014  . Endometriosis   . Hypertension 08/22/2014  . Hypothyroidism   . IBS (irritable bowel syndrome)   . Vitamin D deficiency      Allergies  Allergen Reactions  . Codeine Nausea And Vomiting  . Other Nausea And Vomiting    Any pain med; anasthesia     Current Outpatient Medications  Medication Sig Dispense Refill  . B Complex-C (SUPER B COMPLEX PO) Take by mouth daily.    . Cholecalciferol (VITAMIN D PO) Take 4,000 Units by mouth daily.    Marland Kitchen estradiol (CLIMARA - DOSED IN MG/24 HR) 0.05 mg/24hr patch APPLY ONE PATCH WEEKLY. 4 patch 6  . hydrochlorothiazide (HYDRODIURIL) 25 MG tablet TAKE 1/2 TO 1 TABLET DAILY AS NEEDED FOR FLUID. 30 tablet 6  . losartan (COZAAR) 50 MG tablet Take 50 mg by mouth daily.     . Multiple Vitamins-Minerals (PRESERVISION AREDS PO) Take by mouth. 1 BID    . Omega-3 Fatty Acids (FISH OIL) 500 MG CAPS Take by mouth daily.    Marland Kitchen SYNTHROID 112 MCG tablet Take 112 mcg by mouth daily.      No current facility-administered medications for this visit.      Past Surgical History:  Procedure Laterality Date  . ABDOMINAL HYSTERECTOMY    . BREAST BIOPSY    . BUNIONECTOMY  10/22/2011   Procedure: Lillard Anes;  Surgeon: Marcheta Grammes, DPM;  Location: [facility];  Service: Orthopedics;  Laterality:  Right;  Austin Bunionectomy Right Foot  . CHOLECYSTECTOMY    . FOOT SURGERY    . HAMMER TOE SURGERY  10/22/2011   Procedure: HAMMER TOE CORRECTION;  Surgeon: Marcheta Grammes, DPM;  Location: [facility];  Service: Orthopedics;  Laterality: Right;  Arthroplasty 5th Toe Right Foot  . LAPAROSCOPIC APPENDECTOMY N/A 12/22/2013   Procedure: APPENDECTOMY LAPAROSCOPIC;  Surgeon: Jamesetta So, MD;  Location: [facility];  Service: General;  Laterality: N/A;  . METATARSAL OSTEOTOMY  10/22/2011   Procedure: METATARSAL OSTEOTOMY;  Surgeon: Marcheta Grammes, DPM;  Location: [facility];  Service: Orthopedics;  Laterality: Right;  Possible Aiken Osteotomy Right Foot  . THYROIDECTOMY       Allergies  Allergen Reactions  . Codeine Nausea And Vomiting  . Other Nausea And Vomiting    Any pain med; anasthesia      Family History  Problem Relation Age of Onset  . Heart attack Father        Age 70  . Heart attack Mother        Late 75s  . Cancer Mother        colon  . COPD Sister   . Hypertension Brother   . Breast cancer Sister   . Cancer Maternal Grandfather  pancreatic  . Stroke Paternal Grandmother      Social History Ms. Dusseault reports that she has quit smoking. Her smoking use included cigarettes. She has never used smokeless tobacco. Ms. Dam reports current alcohol use of about 2.0 standard drinks of alcohol per week.   Review of Systems CONSTITUTIONAL: No weight loss, fever, chills, weakness or fatigue.  HEENT: Eyes: No visual loss, blurred vision, double vision or yellow sclerae.No hearing loss, sneezing, congestion, runny nose or sore throat.  SKIN: No rash or itching.  CARDIOVASCULAR: per hpi RESPIRATORY:per hpi GASTROINTESTINAL: No anorexia, nausea, vomiting or diarrhea. No abdominal pain or blood.  GENITOURINARY: No burning on urination, no polyuria NEUROLOGICAL: No headache, dizziness, syncope, paralysis, ataxia, numbness or tingling in the extremities. No change in  bowel or bladder control.  MUSCULOSKELETAL: No muscle, back pain, joint pain or stiffness.  LYMPHATICS: No enlarged nodes. No history of splenectomy.  PSYCHIATRIC: No history of depression or anxiety.  ENDOCRINOLOGIC: No reports of sweating, cold or heat intolerance. No polyuria or polydipsia.  Marland Kitchen   Physical Examination Today's Vitals   03/03/19 0832  BP: 121/72  Pulse: 69  Temp: (!) 96.6 F (35.9 C)  Weight: 134 lb (60.8 kg)  Height: 5\' 3"  (1.6 m)   Body mass index is 23.74 kg/m.  Gen: resting comfortably, no acute distress HEENT: no scleral icterus, pupils equal round and reactive, no palptable cervical adenopathy,  CV: RRR, 3/6 systolic murmur rusb. Mild bilateral carotid bruits Resp: Clear to auscultation bilaterally GI: abdomen is soft, non-tender, non-distended, normal bowel sounds, no hepatosplenomegaly MSK: extremities are warm, no edema.  Skin: warm, no rash Neuro:  no focal deficits Psych: appropriate affect   Diagnostic Studies  01/2014 nuclear stress test IMPRESSION: 1.  Normal Lexiscan Cardiolite stress test.  2. No evidence of ischemia or scar. Soft tissue attenuation artifact noted.  3.  Normal left ventricular systolic function, calculated LVEF 64%.   Assessment and Plan  1. SOB/Heart murmur - somewhat atypical symptoms in that the SOB is when she starts exertion and improves the longer she exerts herself - she does have a systolic murmur on exam suggesting possible aortic sclersosis/stenosis, we will obtain an echo   2. Carotid bruits - obtain caroitd [place]  3. Chest pain - atypical and infrequent - EKG shows SR, no acute ischemic changes - stress test 2015 no ischemia - monitor at this time    F/u pending echo reults  Arnoldo Lenis, M.D., F.A.C.C.

## 2019-03-13 ENCOUNTER — Ambulatory Visit (HOSPITAL_COMMUNITY)
Admission: RE | Admit: 2019-03-13 | Discharge: 2019-03-13 | Disposition: A | Discharge status: Home / Self Care | Payer: Medicare Other | Source: Ambulatory Visit | Attending: Cardiology | Admitting: Cardiology

## 2019-03-13 ENCOUNTER — Other Ambulatory Visit: Payer: Self-pay

## 2019-03-13 DIAGNOSIS — R011 Cardiac murmur, unspecified: Secondary | ICD-10-CM | POA: Insufficient documentation

## 2019-03-13 DIAGNOSIS — I6523 Occlusion and stenosis of bilateral carotid arteries: Secondary | ICD-10-CM | POA: Diagnosis not present

## 2019-03-13 DIAGNOSIS — R0989 Other specified symptoms and signs involving the circulatory and respiratory systems: Secondary | ICD-10-CM | POA: Insufficient documentation

## 2019-03-13 DIAGNOSIS — K219 Gastro-esophageal reflux disease without esophagitis: Secondary | ICD-10-CM | POA: Diagnosis not present

## 2019-03-13 DIAGNOSIS — R0602 Shortness of breath: Secondary | ICD-10-CM

## 2019-03-13 NOTE — Progress Notes (Signed)
*  PRELIMINARY RESULTS* Echocardiogram 2D Echocardiogram has been performed.  Samuel Germany 03/13/2019, 9:16 AM

## 2019-03-20 ENCOUNTER — Telehealth: Payer: Self-pay | Admitting: Orthopaedic Surgery

## 2019-03-20 NOTE — Telephone Encounter (Signed)
Patient called, apologized for having to cancel her appointment for shoulder for this Thursday, 03/23/19 due to injuring her back. States has therefore been unable to do any therapy. Offered to re-schedule office visit; patient said she will call back when able to re-schedule.

## 2019-03-23 ENCOUNTER — Ambulatory Visit: Payer: Medicare Other | Admitting: Orthopaedic Surgery

## 2019-03-24 DIAGNOSIS — Z23 Encounter for immunization: Secondary | ICD-10-CM | POA: Diagnosis not present

## 2019-04-11 ENCOUNTER — Other Ambulatory Visit: Payer: Self-pay | Admitting: Adult Health

## 2019-05-25 DIAGNOSIS — H43813 Vitreous degeneration, bilateral: Secondary | ICD-10-CM | POA: Diagnosis not present

## 2019-05-25 DIAGNOSIS — H26492 Other secondary cataract, left eye: Secondary | ICD-10-CM | POA: Diagnosis not present

## 2019-05-25 DIAGNOSIS — Z961 Presence of intraocular lens: Secondary | ICD-10-CM | POA: Diagnosis not present

## 2019-05-25 DIAGNOSIS — H353132 Nonexudative age-related macular degeneration, bilateral, intermediate dry stage: Secondary | ICD-10-CM | POA: Diagnosis not present

## 2019-06-14 ENCOUNTER — Other Ambulatory Visit: Payer: Self-pay | Admitting: Adult Health

## 2019-07-17 ENCOUNTER — Other Ambulatory Visit: Payer: Self-pay | Admitting: Adult Health

## 2019-08-14 ENCOUNTER — Other Ambulatory Visit: Payer: Self-pay | Admitting: Adult Health

## 2019-09-14 ENCOUNTER — Other Ambulatory Visit: Payer: Self-pay | Admitting: Adult Health

## 2019-10-14 ENCOUNTER — Other Ambulatory Visit: Payer: Self-pay | Admitting: Adult Health

## 2019-11-16 ENCOUNTER — Other Ambulatory Visit: Payer: Self-pay | Admitting: Adult Health

## 2019-11-23 DIAGNOSIS — H353132 Nonexudative age-related macular degeneration, bilateral, intermediate dry stage: Secondary | ICD-10-CM | POA: Diagnosis not present

## 2019-11-23 DIAGNOSIS — H02834 Dermatochalasis of left upper eyelid: Secondary | ICD-10-CM | POA: Diagnosis not present

## 2019-11-23 DIAGNOSIS — Z961 Presence of intraocular lens: Secondary | ICD-10-CM | POA: Diagnosis not present

## 2019-11-23 DIAGNOSIS — H43813 Vitreous degeneration, bilateral: Secondary | ICD-10-CM | POA: Diagnosis not present

## 2019-11-23 DIAGNOSIS — H02831 Dermatochalasis of right upper eyelid: Secondary | ICD-10-CM | POA: Diagnosis not present

## 2019-11-23 DIAGNOSIS — H26492 Other secondary cataract, left eye: Secondary | ICD-10-CM | POA: Diagnosis not present

## 2019-12-15 ENCOUNTER — Other Ambulatory Visit: Payer: Self-pay | Admitting: Adult Health

## 2019-12-25 ENCOUNTER — Other Ambulatory Visit: Payer: Self-pay | Admitting: Adult Health

## 2020-01-16 ENCOUNTER — Other Ambulatory Visit: Payer: Self-pay | Admitting: Adult Health

## 2020-01-29 ENCOUNTER — Other Ambulatory Visit: Payer: Self-pay | Admitting: Adult Health

## 2020-02-08 DIAGNOSIS — Z961 Presence of intraocular lens: Secondary | ICD-10-CM | POA: Diagnosis not present

## 2020-02-08 DIAGNOSIS — H02831 Dermatochalasis of right upper eyelid: Secondary | ICD-10-CM | POA: Diagnosis not present

## 2020-02-08 DIAGNOSIS — H02834 Dermatochalasis of left upper eyelid: Secondary | ICD-10-CM | POA: Diagnosis not present

## 2020-02-09 ENCOUNTER — Ambulatory Visit (INDEPENDENT_AMBULATORY_CARE_PROVIDER_SITE_OTHER): Payer: Medicare Other | Admitting: Adult Health

## 2020-02-09 ENCOUNTER — Encounter: Payer: Self-pay | Admitting: Adult Health

## 2020-02-09 VITALS — BP 135/80 | HR 59 | Ht 63.0 in | Wt 126.2 lb

## 2020-02-09 DIAGNOSIS — Z79899 Other long term (current) drug therapy: Secondary | ICD-10-CM

## 2020-02-09 MED ORDER — ESTRADIOL 0.05 MG/24HR TD PTWK: MEDICATED_PATCH | TRANSDERMAL | 4 refills | Status: DC

## 2020-02-09 NOTE — Progress Notes (Addendum)
Patient ID: Tiffany Kim, female   DOB: [birth year], 79 y.o.   MRN: [id number] History of Present Illness: Tiffany Kim is a 79 year old white female, married, sp hysterectomy in to get estrogen patch refilled, she is aware of benefits and risks and wants to continue. Husband has been sick this year, first with sepsis then C Diff. She is active.  PCP is Dr Nevada Crane.    Current Medications, Allergies, Past Medical History, Past Surgical History, Family History and Social History were reviewed in [employer] record.     Review of Systems: Patient denies any headaches, hearing loss, fatigue, blurred vision, shortness of breath, chest pain, abdominal pain, problems with bowel movements, urination, or intercourse(not active). No joint pain or mood swings.    Physical Exam:BP 135/80 (BP Location: Right Arm, Patient Position: Sitting, Cuff Size: Normal)   Pulse (!) 59   Ht 5\' 3"  (1.6 m)   Wt 126 lb 3.2 oz (57.2 kg)   BMI 22.36 kg/m  General:  Well developed, well nourished, no acute distress Skin:  Warm and dry Neck:  Midline trachea, normal thyroid, good ROM, no lymphadenopathy,n o carotid bruits heard Lungs; Clear to auscultation bilaterally Breast:  No dominant palpable mass, retraction, or nipple discharge Cardiovascular: Regular rate and rhythm Abdomen:  Soft, non tender, no hepatosplenomegaly Pelvic:  Deferred Psych:  No mood changes, alert and cooperative,seems happy AA is 4  Fall risk is low PHQ 9 score is 1   Upstream - 02/09/20 1058      Pregnancy Intention Screening   Does the patient want to become pregnant in the next year? No    Does the patient's partner want to become pregnant in the next year? No    Would the patient like to discuss contraceptive options today? No      Contraception Wrap Up   Current Method No Method - Other Reason   sp HYST   End Method --   sp hyst   Contraception Counseling Provided No           Impression and Plan:  1.  Current use of estrogen therapy Will continue patches  Meds ordered this encounter  Medications  . estradiol (CLIMARA - DOSED IN MG/24 HR) 0.05 mg/24hr patch    Sig: APPLY ONE PATCH WEEKLY.    Dispense:  12 patch    Refill:  4    Order Specific Question:   Supervising Provider    Answer:   Tania Ade H [[id number]]  Follow up with me every 2 years if wants to continue patches  Physical with PCP Labs with PCP She says she is not getting mammograms any more

## 2020-02-16 ENCOUNTER — Other Ambulatory Visit: Payer: Self-pay | Admitting: Adult Health

## 2020-03-14 NOTE — H&P (Signed)
Surgical History & Physical  Patient Name: Tiffany Kim                DOB: Oct 26, 1940   Surgery: Blepharoplasty; Right Upper Lid, Left Upper Lid  Surgeon: Baruch Goldmann MD Surgery Date:  03/29/2020 Pre-Op Date:  03/14/2020  HPI: A 27 Yr. old female patient 1. 1. The patient is returning for a Droopy upper eyelids 2 months follow-up of both eyes, the LUL and the RUL. Since the last visit, the affected area is worsening. The patient's vision is stable. The patient experiences no diplopia. The condition's severity is constant. The patient experiences no flashes, floater, shadow, curtain or veil. States that eyelids have been droopy for several years now. It is very hard to open her eyes. This is negatively affecting the patient's quality of life. HPI Completed by Dr. Baruch Goldmann  Medical History: Macula Degeneration Cataracts ASTIGMATISM High Blood Pressure Thyroid Problems  Review of Systems Negative Allergic/Immunologic Negative Cardiovascular Negative Constitutional Negative Ear, Nose, Mouth & Throat Negative Endocrine Negative Eyes Negative Gastrointestinal Negative Genitourinary Negative Hemotologic/Lymphatic Negative Integumentary Negative Musculoskeletal Negative Neurological Negative Psychiatry Negative Respiratory  Social   Former smoker  Medication Areds Vitamin,  Synthroid, Losartan Potassium, Fish Oil, HRT patch, Metamucil,   Sx/Procedures Phaco c IOL, Laser - Yag Capsulotomy,   Drug Allergies  Codeine, Pain meds,   History & Physical: Heent:  Dermatochalasis, Bilateral upper eyelids NECK: supple without bruits LUNGS: lungs clear to auscultation CV: regular rate and rhythm Abdomen: soft and non-tender  Impression & Plan: Assessment: 1.  DERMATOCHALASIS, no surgery; Right Upper Lid, Left Upper Lid (H02.831, H02.834) 2.  INTRAOCULAR LENS IOL ; Both Eyes (Z96.1)  Plan: 1.  Symptomatic. Bleph VF shows visually significant dermatochalasis. Photos  obtained and interpreted. Risks, benefits, alternatives, and complications discussed. Patient wishes to proceed with surgery.

## 2020-03-26 NOTE — Patient Instructions (Signed)
Tiffany Kim  03/26/2020     @PREFPERIOPPHARMACY @   Your procedure is scheduled on  03/29/2020  Report to [facility] at  [phone]  A.M.  Call this number if you have problems the morning of surgery:  [phone]   Remember:  Do not eat or drink after midnight.                         Take these medicines the morning of surgery with A SIP OF WATER  Levothyroxine.    Do not wear jewelry, make-up or nail polish.  Do not wear lotions, powders, or perfumes. Please wear deodorant and brush your teeth.  Do not shave 48 hours prior to surgery.  Men may shave face and neck.  Do not bring valuables to the hospital.  [facility] is not responsible for any belongings or valuables.  Contacts, dentures or bridgework may not be worn into surgery.  Leave your suitcase in the car.  After surgery it may be brought to your room.  For patients admitted to the hospital, discharge time will be determined by your treatment team.  Patients discharged the day of surgery will not be allowed to drive home.   Name and phone number of your driver:   family Special instructions:   DO NOT smoke the morning of your procedure.  Please read over the following fact sheets that you were given. Anesthesia Post-op Instructions and Care and Recovery After Surgery      Cataract Surgery, Care After This sheet gives you information about how to care for yourself after your procedure. Your health care provider may also give you more specific instructions. If you have problems or questions, contact your health care provider. What can I expect after the procedure? After the procedure, it is common to have:  Itching.  Discomfort.  Fluid discharge.  Sensitivity to light and to touch.  Bruising in or around the eye.  Mild blurred vision. Follow these instructions at home: Eye care   Do not touch or rub your eyes.  Protect your eyes as told by your health care provider. You may be told to wear a  protective eye shield or sunglasses.  Do not put a contact lens into the affected eye or eyes until your health care provider approves.  Keep the area around your eye clean and dry: ? Avoid swimming. ? Do not allow water to hit you directly in the face while showering. ? Keep soap and shampoo out of your eyes.  Check your eye every day for signs of infection. Watch for: ? Redness, swelling, or pain. ? Fluid, blood, or pus. ? Warmth. ? A bad smell. ? Vision that is getting worse. ? Sensitivity that is getting worse. Activity  Do not drive for 24 hours if you were given a sedative during your procedure.  Avoid strenuous activities, such as playing contact sports, for as long as told by your health care provider.  Do not drive or use heavy machinery until your health care provider approves.  Do not bend or lift heavy objects. Bending increases pressure in the eye. You can walk, climb stairs, and do light household chores.  Ask your health care provider when you can return to work. If you work in a dusty environment, you may be advised to wear protective eyewear for a period of time. General instructions  Take or apply over-the-counter and prescription medicines only as told by  your health care provider. This includes eye drops.  Keep all follow-up visits as told by your health care provider. This is important. Contact a health care provider if:  You have increased bruising around your eye.  You have pain that is not helped with medicine.  You have a fever.  You have redness, swelling, or pain in your eye.  You have fluid, blood, or pus coming from your incision.  Your vision gets worse.  Your sensitivity to light gets worse. Get help right away if:  You have sudden loss of vision.  You see flashes of light or spots (floaters).  You have severe eye pain.  You develop nausea or vomiting. Summary  After your procedure, it is common to have itching, discomfort,  bruising, fluid discharge, or sensitivity to light.  Follow instructions from your health care provider about caring for your eye after the procedure.  Do not rub your eye after the procedure. You may need to wear eye protection or sunglasses. Do not wear contact lenses. Keep the area around your eye clean and dry.  Avoid activities that require a lot of effort. These include playing sports and lifting heavy objects.  Contact a health care provider if you have increased bruising, pain that does not go away, or a fever. Get help right away if you suddenly lose your vision, see flashes of light or spots, or have severe pain in the eye. This information is not intended to replace advice given to you by your health care provider. Make sure you discuss any questions you have with your health care provider. Document Revised: 03/28/2019 Document Reviewed: 11/29/2017 Elsevier Patient Education  [place].

## 2020-03-27 ENCOUNTER — Other Ambulatory Visit (HOSPITAL_COMMUNITY)
Admission: RE | Admit: 2020-03-27 | Discharge: 2020-03-27 | Disposition: A | Discharge status: Home / Self Care | Payer: Medicare Other | Source: Ambulatory Visit | Attending: Ophthalmology | Admitting: Ophthalmology

## 2020-03-27 ENCOUNTER — Other Ambulatory Visit: Payer: Self-pay

## 2020-03-27 ENCOUNTER — Encounter (HOSPITAL_COMMUNITY)
Admission: RE | Admit: 2020-03-27 | Discharge: 2020-03-27 | Disposition: A | Discharge status: Home / Self Care | Payer: Medicare Other | Source: Ambulatory Visit | Attending: Ophthalmology | Admitting: Ophthalmology

## 2020-03-27 DIAGNOSIS — Z01812 Encounter for preprocedural laboratory examination: Secondary | ICD-10-CM | POA: Insufficient documentation

## 2020-03-27 DIAGNOSIS — Z20822 Contact with and (suspected) exposure to covid-19: Secondary | ICD-10-CM | POA: Insufficient documentation

## 2020-03-28 LAB — SARS CORONAVIRUS 2 (TAT 6-24 HRS): SARS Coronavirus 2: NEGATIVE

## 2020-03-29 ENCOUNTER — Other Ambulatory Visit: Payer: Self-pay

## 2020-03-29 ENCOUNTER — Encounter (HOSPITAL_COMMUNITY)
Admission: RE | Disposition: A | Discharge status: Home / Self Care | Payer: Self-pay | Source: Home / Self Care | Attending: Ophthalmology

## 2020-03-29 ENCOUNTER — Ambulatory Visit (HOSPITAL_COMMUNITY)
Admission: RE | Admit: 2020-03-29 | Discharge: 2020-03-29 | Disposition: A | Discharge status: Home / Self Care | Payer: Medicare Other | Attending: Ophthalmology | Admitting: Ophthalmology

## 2020-03-29 ENCOUNTER — Ambulatory Visit (HOSPITAL_COMMUNITY): Payer: Medicare Other | Admitting: Anesthesiology

## 2020-03-29 ENCOUNTER — Encounter (HOSPITAL_COMMUNITY): Payer: Self-pay | Admitting: Ophthalmology

## 2020-03-29 DIAGNOSIS — Z79899 Other long term (current) drug therapy: Secondary | ICD-10-CM | POA: Diagnosis not present

## 2020-03-29 DIAGNOSIS — H02403 Unspecified ptosis of bilateral eyelids: Secondary | ICD-10-CM | POA: Insufficient documentation

## 2020-03-29 DIAGNOSIS — Z79890 Hormone replacement therapy (postmenopausal): Secondary | ICD-10-CM | POA: Diagnosis not present

## 2020-03-29 DIAGNOSIS — Z961 Presence of intraocular lens: Secondary | ICD-10-CM | POA: Insufficient documentation

## 2020-03-29 DIAGNOSIS — H02831 Dermatochalasis of right upper eyelid: Secondary | ICD-10-CM | POA: Insufficient documentation

## 2020-03-29 DIAGNOSIS — H02834 Dermatochalasis of left upper eyelid: Secondary | ICD-10-CM | POA: Diagnosis not present

## 2020-03-29 DIAGNOSIS — H52209 Unspecified astigmatism, unspecified eye: Secondary | ICD-10-CM | POA: Insufficient documentation

## 2020-03-29 DIAGNOSIS — I10 Essential (primary) hypertension: Secondary | ICD-10-CM | POA: Insufficient documentation

## 2020-03-29 DIAGNOSIS — Z87891 Personal history of nicotine dependence: Secondary | ICD-10-CM | POA: Diagnosis not present

## 2020-03-29 DIAGNOSIS — Z885 Allergy status to narcotic agent status: Secondary | ICD-10-CM | POA: Insufficient documentation

## 2020-03-29 DIAGNOSIS — H269 Unspecified cataract: Secondary | ICD-10-CM | POA: Insufficient documentation

## 2020-03-29 DIAGNOSIS — H3530 Unspecified macular degeneration: Secondary | ICD-10-CM | POA: Insufficient documentation

## 2020-03-29 DIAGNOSIS — H02836 Dermatochalasis of left eye, unspecified eyelid: Secondary | ICD-10-CM | POA: Diagnosis present

## 2020-03-29 HISTORY — PX: BROW LIFT: SHX178

## 2020-03-29 SURGERY — BLEPHAROPLASTY
Anesthesia: Monitor Anesthesia Care | Site: Eye | Laterality: Bilateral

## 2020-03-29 MED ORDER — LIDOCAINE-EPINEPHRINE (PF) 2 %-1:200000 IJ SOLN
INTRAMUSCULAR | Status: DC | PRN
  Administered 2020-03-29: 3 mL via INTRADERMAL

## 2020-03-29 MED ORDER — ORAL CARE MOUTH RINSE: 15.0000 mL | Freq: Once | OROMUCOSAL | Status: DC

## 2020-03-29 MED ORDER — TETRACAINE HCL 0.5 % OP SOLN
OPHTHALMIC | Status: DC | PRN
  Administered 2020-03-29: 1 [drp] via OPHTHALMIC

## 2020-03-29 MED ORDER — 0.9 % SODIUM CHLORIDE (POUR BTL) OPTIME
TOPICAL | Status: DC | PRN
  Administered 2020-03-29: 1000 mL

## 2020-03-29 MED ORDER — LIDOCAINE 2% (20 MG/ML) 5 ML SYRINGE
INTRAMUSCULAR | Status: AC
  Filled 2020-03-29: qty 5

## 2020-03-29 MED ORDER — ERYTHROMYCIN 5 MG/GM OP OINT
TOPICAL_OINTMENT | Freq: Once | OPHTHALMIC | Status: DC
  Filled 2020-03-29: qty 3.5

## 2020-03-29 MED ORDER — LIDOCAINE-EPINEPHRINE (PF) 2 %-1:200000 IJ SOLN
10.0000 mL | Freq: Once | INTRAMUSCULAR | Status: DC
  Filled 2020-03-29: qty 10

## 2020-03-29 MED ORDER — TETRACAINE HCL 0.5 % OP SOLN: 1.0000 [drp] | Freq: Once | OPHTHALMIC | Status: DC

## 2020-03-29 MED ORDER — PROPOFOL 10 MG/ML IV BOLUS
INTRAVENOUS | Status: AC
  Filled 2020-03-29: qty 20

## 2020-03-29 MED ORDER — CHLORHEXIDINE GLUCONATE 0.12 % MT SOLN: 15.0000 mL | Freq: Once | OROMUCOSAL | Status: DC

## 2020-03-29 MED ORDER — LACTATED RINGERS IV SOLN: Freq: Once | INTRAVENOUS | Status: DC

## 2020-03-29 MED ORDER — LIDOCAINE 2% (20 MG/ML) 5 ML SYRINGE
INTRAMUSCULAR | Status: DC | PRN
  Administered 2020-03-29: 40 mg via INTRAVENOUS

## 2020-03-29 MED ORDER — ERYTHROMYCIN 5 MG/GM OP OINT
TOPICAL_OINTMENT | OPHTHALMIC | Status: DC | PRN
  Administered 2020-03-29: 1 via OPHTHALMIC

## 2020-03-29 MED ORDER — PROPOFOL 10 MG/ML IV BOLUS
INTRAVENOUS | Status: DC | PRN
  Administered 2020-03-29: 10 mg via INTRAVENOUS
  Administered 2020-03-29: 30 mg via INTRAVENOUS
  Administered 2020-03-29: 20 mg via INTRAVENOUS
  Administered 2020-03-29: 10 mg via INTRAVENOUS

## 2020-03-29 SURGICAL SUPPLY — 26 items
APL SWBSTK 6 STRL LF DISP (MISCELLANEOUS) ×5
APPLICATOR COTTON TIP 6 STRL (MISCELLANEOUS) ×5 IMPLANT
APPLICATOR COTTON TIP 6IN STRL (MISCELLANEOUS) ×10
BLADE SURG 15 STRL LF DISP TIS (BLADE) ×1 IMPLANT
BLADE SURG 15 STRL SS (BLADE) ×2
CAUTERY SURG HI TEMP FINE TP (MISCELLANEOUS) ×2 IMPLANT
CLOTH BEACON ORANGE TIMEOUT ST (SAFETY) ×2 IMPLANT
COVER WAND RF STERILE (DRAPES) ×2 IMPLANT
DECANTER SPIKE VIAL GLASS SM (MISCELLANEOUS) ×2 IMPLANT
GAUZE 4X4 16PLY RFD (DISPOSABLE) ×2 IMPLANT
GAUZE SPONGE 4X4 12PLY STRL (GAUZE/BANDAGES/DRESSINGS) ×2 IMPLANT
GLOVE BIO SURGEON STRL SZ7 (GLOVE) ×1 IMPLANT
GLOVE BIOGEL PI IND STRL 7.0 (GLOVE) ×2 IMPLANT
GLOVE BIOGEL PI INDICATOR 7.0 (GLOVE) ×2
GLOVE SURG SS PI 7.5 STRL IVOR (GLOVE) ×2 IMPLANT
GOWN STRL REUS W/TWL LRG LVL3 (GOWN DISPOSABLE) ×2 IMPLANT
GOWN STRL REUS W/TWL XL LVL3 (GOWN DISPOSABLE) ×2 IMPLANT
KIT BLADEGUARD II DBL (SET/KITS/TRAYS/PACK) ×2 IMPLANT
MARKER SKIN DUAL TIP RULER LAB (MISCELLANEOUS) ×2 IMPLANT
NDL HYPO 27GX1-1/4 (NEEDLE) ×1 IMPLANT
NEEDLE HYPO 27GX1-1/4 (NEEDLE) ×2 IMPLANT
PACK BASIC III (CUSTOM PROCEDURE TRAY) ×2
PACK SRG BSC III STRL LF ECLPS (CUSTOM PROCEDURE TRAY) ×1 IMPLANT
SET BASIN LINEN APH (SET/KITS/TRAYS/PACK) ×2 IMPLANT
SUT 6 0 PLAIN (SUTURE) ×3 IMPLANT
SYR CONTROL 10ML LL (SYRINGE) ×2 IMPLANT

## 2020-03-29 NOTE — Interval H&P Note (Signed)
History and Physical Interval Note:  03/29/2020 10:01 AM  Tiffany Kim  has presented today for surgery, with the diagnosis of Bilateral upper eyelid dermatochalasis.  The various methods of treatment have been discussed with the patient and family. After consideration of risks, benefits and other options for treatment, the patient has consented to  Procedure(s): BLEPHAROPLASTY (Bilateral) as a surgical intervention.  The patient's history has been reviewed, patient examined, no change in status, stable for surgery.  I have reviewed the patient's chart and labs.  Questions were answered to the patient's satisfaction.     Baruch Goldmann

## 2020-03-29 NOTE — Anesthesia Postprocedure Evaluation (Signed)
Anesthesia Post Note  Patient: Tiffany Kim  Procedure(s) Performed: BLEPHAROPLASTY (Bilateral Eye)  Patient location during evaluation: PACU Anesthesia Type: MAC Level of consciousness: awake and alert and oriented Pain management: pain level controlled Vital Signs Assessment: post-procedure vital signs reviewed and stable Respiratory status: spontaneous breathing Cardiovascular status: blood pressure returned to baseline Postop Assessment: no apparent nausea or vomiting Anesthetic complications: no   No complications documented.   Last Vitals:  Vitals:   03/29/20 1115 03/29/20 1125  BP: 131/65 (!) 122/42  Pulse: 64 68  Resp: 12 18  Temp:  36.4 C  SpO2: 99% 97%    Last Pain:  Vitals:   03/29/20 1125  TempSrc: Axillary  PainSc: 0-No pain                 Kjell Brannen C Ahman Dugdale

## 2020-03-29 NOTE — Op Note (Signed)
Procedure Date: 03/29/20  Preoperative Diagnosis: Visually significant dermatochalasis, both eyes   Postoperative Diagnosis:  Visually significant dermatochalasis, both eyes   Procedure Performed:  Bilateral upper lid blepharoplasty    Surgeon: Baruch Goldmann, M.D.  Assistants: None  Anesthesia:  1. Local infiltrative 2% lidocaine with epinephrine 1:200,000 2. MAC  Specimens: None   Estimated Blood Loss: Less than 1 mL   Complications: None    Indications for Surgery:   Because of the visually significant dermatochalasis of both eyes, it was recommended to do bilateral upper lid blepharoplasty.   The common risks and benefits of surgery were discussed, including inability to close the eyes, over-correction, under-correction, asymmetry, corneal exposure, double vision, blindness and undesired cosmetic result.   Procedure: The patient was brought back to the operating room and was placed in the supine position. The patient was prepped and draped in the usual sterile fashion. Calipers were used to measure ?? mm above the upper lash line in both eyes. A forceps pinch technique was used to carefully measure the amount of skin to be taken off both eyes and care was taken that no lagophthalmos would likely result. From that point a surgical marking pen was used to mark the appropriate amount of skin to be taken from both eyes. Both surgical sites were then injected subcutaneously with local anesthetic (2% lidocaine with epinephrine 1:100,000). After adequate anesthesia was found to be achieved, attention was turned to the left side where a 15 blade was used to incise the skin along the premarked lines. After this was achieved, the cutaneous layer was removed using Westcott scissors and forceps.  Hemostasis was achieved with electocautery.  Three interrupted fast absorbing 6-0 gut sutures were placed. A running 6-0 gut suture was used to close the skin. After this was achieved, attention was  turned to the right eye where a similar procedure was performed, including using a 15 blade to incise the skin, using Westcott scissors and forceps to remove the skin, cautery, and using 6-0 gut sutures, first interrupted and then running, to finish the wound.  After the wound was closed and adequate hemostasis was found to be achieved, the drape was removed and patient was cleaned with wet and dry 4x4s, and Erythromycin ointment was applied to both suture lines. Ice packs were placed over both eyes.   Post-Op Plan/Instructions: A prescription for erythromycin ointment was given. An instruction sheet was given. Ice packs are to be used for 48 hours 20 minutes on and 20 minutes off while awake for 2 days. Antibiotic ointment used for 7 days. A follow up visit will in two weeks in clinic. Patient instructed to call if signs of bleeding, infection, significant pain, or any other concerning symptoms.  Disposition: Home under self care.

## 2020-03-29 NOTE — Transfer of Care (Signed)
Immediate Anesthesia Transfer of Care Note  Patient: Tiffany Kim  Procedure(s) Performed: BLEPHAROPLASTY (Bilateral Eye)  Patient Location: PACU  Anesthesia Type:MAC  Level of Consciousness: awake, alert , oriented and patient cooperative  Airway & Oxygen Therapy: Patient Spontanous Breathing  Post-op Assessment: Report given to RN, Post -op Vital signs reviewed and stable and Patient moving all extremities  Post vital signs: Reviewed and stable  Last Vitals:  Vitals Value Taken Time  BP    Temp    Pulse    Resp    SpO2      Last Pain:  Vitals:   03/29/20 0942  TempSrc: Oral  PainSc: 0-No pain      Patients Stated Pain Goal: 8 (50/01/64 2903)  Complications: No complications documented.

## 2020-03-29 NOTE — Discharge Instructions (Addendum)
Please discharge patient when stable, will follow up with Dr. Marisa Hua at the [facility] in 2 weeks. Please give antibiotic ointment to patient.  Patient was provided written post-op instructions already.  Ice 20 minutes on, 20 minutes of while awake for the next 48 hours. Erythromycin ointment to the suture line 3x/day for 1 week.       Monitored Anesthesia Care, Care After These instructions provide you with information about caring for yourself after your procedure. Your health care provider may also give you more specific instructions. Your treatment has been planned according to current medical practices, but problems sometimes occur. Call your health care provider if you have any problems or questions after your procedure. What can I expect after the procedure? After your procedure, you may:  Feel sleepy for several hours.  Feel clumsy and have poor balance for several hours.  Feel forgetful about what happened after the procedure.  Have poor judgment for several hours.  Feel nauseous or vomit.  Have a sore throat if you had a breathing tube during the procedure. Follow these instructions at home: For at least 24 hours after the procedure:      Have a responsible adult stay with you. It is important to have someone help care for you until you are awake and alert.  Rest as needed.  Do not: ? Participate in activities in which you could fall or become injured. ? Drive. ? Use heavy machinery. ? Drink alcohol. ? Take sleeping pills or medicines that cause drowsiness. ? Make important decisions or sign legal documents. ? Take care of children on your own. Eating and drinking  Follow the diet that is recommended by your health care provider.  If you vomit, drink water, juice, or soup when you can drink without vomiting.  Make sure you have little or no nausea before eating solid foods. General instructions  Take over-the-counter and prescription  medicines only as told by your health care provider.  If you have sleep apnea, surgery and certain medicines can increase your risk for breathing problems. Follow instructions from your health care provider about wearing your sleep device: ? Anytime you are sleeping, including during daytime naps. ? While taking prescription pain medicines, sleeping medicines, or medicines that make you drowsy.  If you smoke, do not smoke without supervision.  Keep all follow-up visits as told by your health care provider. This is important. Contact a health care provider if:  You keep feeling nauseous or you keep vomiting.  You feel light-headed.  You develop a rash.  You have a fever. Get help right away if:  You have trouble breathing. Summary  For several hours after your procedure, you may feel sleepy and have poor judgment.  Have a responsible adult stay with you for at least 24 hours or until you are awake and alert. This information is not intended to replace advice given to you by your health care provider. Make sure you discuss any questions you have with your health care provider. Document Revised: 08/30/2017 Document Reviewed: 09/22/2015 Elsevier Patient Education  [place] After These instructions provide you with information about caring for yourself after your procedure. Your health care provider may also give you more specific instructions. Your treatment has been planned according to current medical practices, but problems sometimes occur. Call your health care provider if you have any problems or questions after your  procedure. What can I expect after the procedure? After your procedure, you may:  Feel sleepy for several hours.  Feel clumsy and have poor balance for several hours.  Feel forgetful about what happened after the procedure.  Have poor judgment for several hours.  Feel nauseous or vomit.  Have a  sore throat if you had a breathing tube during the procedure. Follow these instructions at home: For at least 24 hours after the procedure:      Have a responsible adult stay with you. It is important to have someone help care for you until you are awake and alert.  Rest as needed.  Do not: ? Participate in activities in which you could fall or become injured. ? Drive. ? Use heavy machinery. ? Drink alcohol. ? Take sleeping pills or medicines that cause drowsiness. ? Make important decisions or sign legal documents. ? Take care of children on your own. Eating and drinking  Follow the diet that is recommended by your health care provider.  If you vomit, drink water, juice, or soup when you can drink without vomiting.  Make sure you have little or no nausea before eating solid foods. General instructions  Take over-the-counter and prescription medicines only as told by your health care provider.  If you have sleep apnea, surgery and certain medicines can increase your risk for breathing problems. Follow instructions from your health care provider about wearing your sleep device: ? Anytime you are sleeping, including during daytime naps. ? While taking prescription pain medicines, sleeping medicines, or medicines that make you drowsy.  If you smoke, do not smoke without supervision.  Keep all follow-up visits as told by your health care provider. This is important. Contact a health care provider if:  You keep feeling nauseous or you keep vomiting.  You feel light-headed.  You develop a rash.  You have a fever. Get help right away if:  You have trouble breathing. Summary  For several hours after your procedure, you may feel sleepy and have poor judgment.  Have a responsible adult stay with you for at least 24 hours or until you are awake and alert. This information is not intended to replace advice given to you by your health care provider. Make sure you discuss any  questions you have with your health care provider. Document Revised: 08/30/2017 Document Reviewed: 09/22/2015 Elsevier Patient Education  [place].

## 2020-03-29 NOTE — Anesthesia Preprocedure Evaluation (Signed)
Anesthesia Evaluation  Patient identified by MRN, date of birth, ID band Patient awake    Reviewed: Allergy & Precautions, NPO status , Patient's Chart, lab work & pertinent test results  History of Anesthesia Complications (+) PONV and history of anesthetic complications  Airway        Dental  (+) Dental Advisory Given   Pulmonary former smoker,    Pulmonary exam normal breath sounds clear to auscultation       Cardiovascular hypertension, Pt. on medications Normal cardiovascular exam Rhythm:Regular Rate:Normal     Neuro/Psych negative neurological ROS  negative psych ROS   GI/Hepatic negative GI ROS, (+)     substance abuse  alcohol use,   Endo/Other  Hypothyroidism   Renal/GU negative Renal ROS     Musculoskeletal  (+) Arthritis ,   Abdominal   Peds  Hematology   Anesthesia Other Findings   Reproductive/Obstetrics                             Anesthesia Physical Anesthesia Plan  ASA: II  Anesthesia Plan: MAC   Post-op Pain Management:    Induction:   PONV Risk Score and Plan: Ondansetron  Airway Management Planned: Nasal Cannula and Natural Airway  Additional Equipment:   Intra-op Plan:   Post-operative Plan:   Informed Consent: I have reviewed the patients History and Physical, chart, labs and discussed the procedure including the risks, benefits and alternatives for the proposed anesthesia with the patient or authorized representative who has indicated his/her understanding and acceptance.     Dental advisory given  Plan Discussed with: CRNA and Surgeon  Anesthesia Plan Comments:         Anesthesia Quick Evaluation

## 2020-04-01 ENCOUNTER — Encounter (HOSPITAL_COMMUNITY): Payer: Self-pay | Admitting: Ophthalmology

## 2020-04-09 DIAGNOSIS — Z23 Encounter for immunization: Secondary | ICD-10-CM | POA: Diagnosis not present

## 2020-05-16 ENCOUNTER — Ambulatory Visit: Payer: Medicare Other | Attending: Internal Medicine

## 2020-05-16 DIAGNOSIS — Z23 Encounter for immunization: Secondary | ICD-10-CM

## 2020-05-16 NOTE — Progress Notes (Signed)
   Covid-19 Vaccination Clinic  Name:  Tiffany Kim    MRN: [id number] DOB: Apr 16, 1941  05/16/2020  [name] was observed post Covid-19 immunization for 15 minutes without incident. She was provided with Vaccine Information Sheet and instruction to access the V-Safe system.   [name] was instructed to call 911 with any severe reactions post vaccine: Marland Kitchen Difficulty breathing  . Swelling of face and throat  . A fast heartbeat  . A bad rash all over body  . Dizziness and weakness   Immunizations Administered    No immunizations on file.

## 2020-05-19 ENCOUNTER — Ambulatory Visit
Admission: EM | Admit: 2020-05-19 | Discharge: 2020-05-19 | Disposition: A | Discharge status: Home / Self Care | Payer: Medicare Other | Attending: Emergency Medicine | Admitting: Emergency Medicine

## 2020-05-19 ENCOUNTER — Telehealth: Payer: Self-pay | Admitting: Emergency Medicine

## 2020-05-19 DIAGNOSIS — N390 Urinary tract infection, site not specified: Secondary | ICD-10-CM

## 2020-05-19 DIAGNOSIS — R350 Frequency of micturition: Secondary | ICD-10-CM | POA: Insufficient documentation

## 2020-05-19 DIAGNOSIS — R300 Dysuria: Secondary | ICD-10-CM | POA: Diagnosis not present

## 2020-05-19 LAB — POCT URINALYSIS DIP (MANUAL ENTRY)
Bilirubin, UA: NEGATIVE
Glucose, UA: NEGATIVE mg/dL
Ketones, POC UA: NEGATIVE mg/dL
Nitrite, UA: POSITIVE — AB
Protein Ur, POC: NEGATIVE mg/dL
Spec Grav, UA: 1.02 (ref 1.010–1.025)
Urobilinogen, UA: 0.2 E.U./dL
pH, UA: 6 (ref 5.0–8.0)

## 2020-05-19 MED ORDER — SULFAMETHOXAZOLE-TRIMETHOPRIM 800-160 MG PO TABS: 1.0000 | ORAL_TABLET | Freq: Two times a day (BID) | ORAL | 0 refills | Status: AC

## 2020-05-19 NOTE — ED Provider Notes (Addendum)
[facility]   Chief Complaint  Patient presents with  . Dysuria     SUBJECTIVE:  Tiffany Kim is a 79 y.o. female who presented to the urgent care for complaint of dysuria and urinary frequency for the past week.  Denies  a precipitating event, recent sexual encounter, excessive caffeine intake.  Has tried OTC medications without relief.  Symptoms are made worse with urination.  Admits to similar symptoms in the past.  Denies fever, chills, nausea, vomiting, abdominal pain, flank pain, abnormal vaginal discharge or bleeding, hematuria.    LMP: No LMP recorded. Patient has had a hysterectomy.  ROS: As in HPI.  All other pertinent ROS negative.     Past Medical History:  Diagnosis Date  . Current use of estrogen therapy 08/22/2014  . Dizziness 08/22/2014  . Endometriosis   . Hypertension 08/22/2014  . Hypothyroidism   . IBS (irritable bowel syndrome)   . Vitamin D deficiency    Past Surgical History:  Procedure Laterality Date  . ABDOMINAL HYSTERECTOMY    . BREAST BIOPSY    . BROW LIFT Bilateral 03/29/2020   Procedure: BLEPHAROPLASTY;  Surgeon: Baruch Goldmann, MD;  Location: [facility];  Service: Ophthalmology;  Laterality: Bilateral;  . BUNIONECTOMY  10/22/2011   Procedure: Lillard Anes;  Surgeon: Marcheta Grammes, DPM;  Location: [facility];  Service: Orthopedics;  Laterality: Right;  Austin Bunionectomy Right Foot  . CHOLECYSTECTOMY    . FOOT SURGERY    . HAMMER TOE SURGERY  10/22/2011   Procedure: HAMMER TOE CORRECTION;  Surgeon: Marcheta Grammes, DPM;  Location: [facility];  Service: Orthopedics;  Laterality: Right;  Arthroplasty 5th Toe Right Foot  . LAPAROSCOPIC APPENDECTOMY N/A 12/22/2013   Procedure: APPENDECTOMY LAPAROSCOPIC;  Surgeon: Jamesetta So, MD;  Location: [facility];  Service: General;  Laterality: N/A;  . METATARSAL OSTEOTOMY  10/22/2011   Procedure: METATARSAL OSTEOTOMY;  Surgeon: Marcheta Grammes, DPM;  Location: [facility];  Service: Orthopedics;   Laterality: Right;  Possible Aiken Osteotomy Right Foot  . THYROIDECTOMY     Allergies  Allergen Reactions  . Codeine Nausea And Vomiting  . Other Nausea And Vomiting    Any pain med; anesthesia   No current facility-administered medications on file prior to encounter.   Current Outpatient Medications on File Prior to Encounter  Medication Sig Dispense Refill  . B Complex-C (SUPER B COMPLEX PO) Take 1 capsule by mouth daily.     . Cholecalciferol (VITAMIN D PO) Take 2,000 Units by mouth daily.     Marland Kitchen estradiol (CLIMARA - DOSED IN MG/24 HR) 0.05 mg/24hr patch APPLY ONE PATCH WEEKLY. (Patient taking differently: Place 0.05 mg onto the skin once a week. ) 12 patch 4  . hydrochlorothiazide (HYDRODIURIL) 25 MG tablet TAKE 1/2 TO 1 TABLET DAILY AS NEEDED FOR FLUID. (Patient taking differently: Take 25 mg by mouth daily. ) 30 tablet 6  . levothyroxine (SYNTHROID) 100 MCG tablet Take 100 mcg by mouth daily before breakfast.     . losartan (COZAAR) 50 MG tablet Take 50 mg by mouth daily.     . Multiple Vitamin (MULTIVITAMIN WITH MINERALS) TABS tablet Take 1 tablet by mouth daily.    . Multiple Vitamins-Minerals (PRESERVISION AREDS PO) Take 1 capsule by mouth in the morning and at bedtime.     Vladimir Faster Glycol-Propyl Glycol (SYSTANE OP) Place 1 drop into both eyes 2 (two) times daily as needed (dry eyes).    . sodium chloride (OCEAN) 0.65 %  SOLN nasal spray Place 1 spray into both nostrils daily.     Social History   Socioeconomic History  . Marital status: Widowed    Spouse name: Not on file  . Number of children: 2  . Years of education: Not on file  . Highest education level: Not on file  Occupational History  . Not on file  Tobacco Use  . Smoking status: Former Smoker    Types: Cigarettes  . Smokeless tobacco: Never Used  Substance and Sexual Activity  . Alcohol use: Yes    Alcohol/week: 2.0 standard drinks    Types: 2 Glasses of wine per week    Comment: wine every night  .  Drug use: No  . Sexual activity: Not Currently    Birth control/protection: Surgical    Comment: hyst  Other Topics Concern  . Not on file  Social History Narrative  . Not on file   Social Determinants of Health   Financial Resource Strain: Low Risk   . Difficulty of Paying Living Expenses: Not hard at all  Food Insecurity: No Food Insecurity  . Worried About Charity fundraiser in the Last Year: Never true  . Ran Out of Food in the Last Year: Never true  Transportation Needs: No Transportation Needs  . Lack of Transportation (Medical): No  . Lack of Transportation (Non-Medical): No  Physical Activity: Inactive  . Days of Exercise per Week: 0 days  . Minutes of Exercise per Session: 0 min  Stress: No Stress Concern Present  . Feeling of Stress : Only a little  Social Connections: Moderately Integrated  . Frequency of Communication with Friends and Family: More than three times a week  . Frequency of Social Gatherings with Friends and Family: Once a week  . Attends Religious Services: More than 4 times per year  . Active Member of Clubs or Organizations: Not on file  . Attends Archivist Meetings: Never  . Marital Status: Married  Human resources officer Violence: Not At Risk  . Fear of Current or Ex-Partner: No  . Emotionally Abused: No  . Physically Abused: No  . Sexually Abused: No   Family History  Problem Relation Age of Onset  . Heart attack Father        Age 41  . Heart attack Mother        Late 33s  . Cancer Mother        colon  . COPD Sister   . Hypertension Brother   . Breast cancer Sister   . Cancer Maternal Grandfather        pancreatic  . Stroke Paternal Grandmother     OBJECTIVE:  Vitals:   05/19/20 0814  BP: 121/78  Pulse: 76  Resp: 16  Temp: 97.8 F (36.6 C)  SpO2: 97%   General appearance: AOx3 in no acute distress HEENT: NCAT.  Oropharynx clear.  Lungs: clear to auscultation bilaterally without adventitious breath sounds Heart:  regular rate and rhythm.  Radial pulses 2+ symmetrical bilaterally Abdomen: soft; non-distended; no tenderness; bowel sounds present; no guarding or rebound tenderness Back: no CVA tenderness Extremities: no edema; symmetrical with no gross deformities Skin: warm and dry Neurologic: Ambulates from chair to exam table without difficulty Psychological: alert and cooperative; normal mood and affect  Labs Reviewed  URINE CULTURE  POCT URINALYSIS DIP (MANUAL ENTRY)    ASSESSMENT & PLAN:  1. Dysuria   2. Urine frequency     No orders of the defined  types were placed in this encounter.  Urinalysis was ordered and still pending as patient was unable to urinate in office.  She will return as soon as she gets a sample for a nursing visit  Discharge Instructions  Push fluids and get plenty of rest.   Return to the office with a urine sample to have urine analysis completed  Follow up with PCP if symptoms persists Return here or go to ER if you have any new or worsening symptoms such as fever, worsening abdominal pain, nausea/vomiting, flank pain, etc...  Outlined signs and symptoms indicating need for more acute intervention. Patient verbalized understanding. After Visit Summary given.     Emerson Monte, FNP 05/19/20 0914   Addendum at 88 Pm: Patient returned for urine analysis. POCT urinalysis is positive for small RBC, positive nitrate and small white blood cell.  She will be diagnosed for acute UTI.  Bactrim DS will be prescribed.  Urine culture will be sent   Emerson Monte, FNP 05/19/20 1228

## 2020-05-19 NOTE — Telephone Encounter (Signed)
She will return for a nursing visit for urine analysis.  Was positive for UTI.  Bactrim DS was sent to [place] on file

## 2020-05-19 NOTE — Discharge Instructions (Addendum)
Push fluids and get plenty of rest.   Return to the office with a urine sample to have urine analysis completed  Follow up with PCP if symptoms persists Return here or go to ER if you have any new or worsening symptoms such as fever, worsening abdominal pain, nausea/vomiting, flank pain, etc..

## 2020-05-19 NOTE — ED Triage Notes (Signed)
Triaged by provider  

## 2020-05-20 DIAGNOSIS — L570 Actinic keratosis: Secondary | ICD-10-CM | POA: Diagnosis not present

## 2020-05-20 DIAGNOSIS — X32XXXD Exposure to sunlight, subsequent encounter: Secondary | ICD-10-CM | POA: Diagnosis not present

## 2020-05-20 DIAGNOSIS — M7130 Other bursal cyst, unspecified site: Secondary | ICD-10-CM | POA: Diagnosis not present

## 2020-05-20 DIAGNOSIS — L218 Other seborrheic dermatitis: Secondary | ICD-10-CM | POA: Diagnosis not present

## 2020-05-20 DIAGNOSIS — L820 Inflamed seborrheic keratosis: Secondary | ICD-10-CM | POA: Diagnosis not present

## 2020-05-21 LAB — URINE CULTURE: Culture: 50000 — AB

## 2020-06-04 DIAGNOSIS — J019 Acute sinusitis, unspecified: Secondary | ICD-10-CM | POA: Diagnosis not present

## 2020-06-04 DIAGNOSIS — I10 Essential (primary) hypertension: Secondary | ICD-10-CM | POA: Diagnosis not present

## 2020-06-04 DIAGNOSIS — E039 Hypothyroidism, unspecified: Secondary | ICD-10-CM | POA: Diagnosis not present

## 2020-06-04 DIAGNOSIS — J45998 Other asthma: Secondary | ICD-10-CM | POA: Diagnosis not present

## 2020-06-04 DIAGNOSIS — J45990 Exercise induced bronchospasm: Secondary | ICD-10-CM | POA: Diagnosis not present

## 2020-06-04 DIAGNOSIS — R011 Cardiac murmur, unspecified: Secondary | ICD-10-CM | POA: Diagnosis not present

## 2020-06-04 DIAGNOSIS — Z712 Person consulting for explanation of examination or test findings: Secondary | ICD-10-CM | POA: Diagnosis not present

## 2020-06-04 DIAGNOSIS — Z0000 Encounter for general adult medical examination without abnormal findings: Secondary | ICD-10-CM | POA: Diagnosis not present

## 2020-06-04 DIAGNOSIS — Z6823 Body mass index (BMI) 23.0-23.9, adult: Secondary | ICD-10-CM | POA: Diagnosis not present

## 2020-06-11 DIAGNOSIS — I10 Essential (primary) hypertension: Secondary | ICD-10-CM | POA: Diagnosis not present

## 2020-06-11 DIAGNOSIS — J4520 Mild intermittent asthma, uncomplicated: Secondary | ICD-10-CM | POA: Diagnosis not present

## 2020-06-11 DIAGNOSIS — R011 Cardiac murmur, unspecified: Secondary | ICD-10-CM | POA: Diagnosis not present

## 2020-06-11 DIAGNOSIS — Z6823 Body mass index (BMI) 23.0-23.9, adult: Secondary | ICD-10-CM | POA: Diagnosis not present

## 2020-06-11 DIAGNOSIS — E039 Hypothyroidism, unspecified: Secondary | ICD-10-CM | POA: Diagnosis not present

## 2020-06-11 DIAGNOSIS — J45990 Exercise induced bronchospasm: Secondary | ICD-10-CM | POA: Diagnosis not present

## 2020-06-11 DIAGNOSIS — J019 Acute sinusitis, unspecified: Secondary | ICD-10-CM | POA: Diagnosis not present

## 2020-06-11 DIAGNOSIS — E785 Hyperlipidemia, unspecified: Secondary | ICD-10-CM | POA: Diagnosis not present

## 2020-06-11 DIAGNOSIS — J45998 Other asthma: Secondary | ICD-10-CM | POA: Diagnosis not present

## 2020-06-11 DIAGNOSIS — R945 Abnormal results of liver function studies: Secondary | ICD-10-CM | POA: Diagnosis not present

## 2020-06-11 DIAGNOSIS — N951 Menopausal and female climacteric states: Secondary | ICD-10-CM | POA: Diagnosis not present

## 2020-06-11 DIAGNOSIS — R0602 Shortness of breath: Secondary | ICD-10-CM | POA: Diagnosis not present

## 2020-06-11 DIAGNOSIS — F320 Major depressive disorder, single episode, mild: Secondary | ICD-10-CM | POA: Diagnosis not present

## 2020-06-25 DIAGNOSIS — L821 Other seborrheic keratosis: Secondary | ICD-10-CM | POA: Diagnosis not present

## 2020-06-25 DIAGNOSIS — M7130 Other bursal cyst, unspecified site: Secondary | ICD-10-CM | POA: Diagnosis not present

## 2020-10-18 ENCOUNTER — Encounter: Payer: Self-pay | Admitting: Adult Health

## 2020-10-18 ENCOUNTER — Ambulatory Visit (INDEPENDENT_AMBULATORY_CARE_PROVIDER_SITE_OTHER): Payer: Medicare Other | Admitting: Adult Health

## 2020-10-18 ENCOUNTER — Other Ambulatory Visit: Payer: Self-pay

## 2020-10-18 VITALS — BP 148/83 | HR 66 | Ht 63.5 in | Wt 128.0 lb

## 2020-10-18 DIAGNOSIS — R350 Frequency of micturition: Secondary | ICD-10-CM

## 2020-10-18 DIAGNOSIS — R319 Hematuria, unspecified: Secondary | ICD-10-CM

## 2020-10-18 LAB — POCT URINALYSIS DIPSTICK
Glucose, UA: NEGATIVE
Ketones, UA: NEGATIVE
Leukocytes, UA: NEGATIVE
Nitrite, UA: NEGATIVE
Protein, UA: NEGATIVE

## 2020-10-18 MED ORDER — CEPHALEXIN 500 MG PO CAPS: 500.0000 mg | ORAL_CAPSULE | Freq: Three times a day (TID) | ORAL | 0 refills | Status: DC

## 2020-10-18 NOTE — Progress Notes (Signed)
  Subjective:     Patient ID: Tiffany Kim, female   DOB: [birth year], 80 y.o.   MRN: [id number]  HPI Tiffany Kim is 80 year old white female, widowed, sp hysterectomy in complaining of urianry frequency and pressure for few days. PCP is Dr Nevada Crane.   Review of Systems Has urinary frequency for few days took AZO Some pressure Reviewed past medical,surgical, social and family history. Reviewed medications and allergies.     Objective:   Physical Exam BP (!) 148/83 (BP Location: Left Arm, Patient Position: Sitting, Cuff Size: Normal)   Pulse 66   Ht 5' 3.5" (1.613 m)   Wt 128 lb (58.1 kg)   BMI 22.32 kg/m urine dipstick trace blood. Skin warm and dry. Lungs: clear to ausculation bilaterally. Cardiovascular: regular rate and rhythm.Has soft murmur. No CVAT or bladder tendernes Declines pelvic exam Fall risk is low  Upstream - 10/18/20 1012      Pregnancy Intention Screening   Does the patient want to become pregnant in the next year? N/A    Does the patient's partner want to become pregnant in the next year? N/A    Would the patient like to discuss contraceptive options today? N/A      Contraception Wrap Up   Current Method Female Sterilization   hyst   End Method Female Sterilization   hyst   Contraception Counseling Provided No             Assessment:     1. Urinary frequency Urine sent for culture  2. Hematuria, unspecified type Urine sent for culture Will rx keflex Meds ordered this encounter  Medications  . cephALEXin (KEFLEX) 500 MG capsule    Sig: Take 1 capsule (500 mg total) by mouth 3 (three) times daily.    Dispense:  21 capsule    Refill:  0    Order Specific Question:   Supervising Provider    Answer:   Florian Buff [[id number]]      Plan:     Follow up prn

## 2020-10-21 ENCOUNTER — Other Ambulatory Visit: Payer: Self-pay

## 2020-10-21 ENCOUNTER — Ambulatory Visit
Admission: EM | Admit: 2020-10-21 | Discharge: 2020-10-21 | Disposition: A | Discharge status: Home / Self Care | Payer: Medicare Other | Attending: Family Medicine | Admitting: Family Medicine

## 2020-10-21 ENCOUNTER — Encounter: Payer: Self-pay | Admitting: Emergency Medicine

## 2020-10-21 DIAGNOSIS — B349 Viral infection, unspecified: Secondary | ICD-10-CM

## 2020-10-21 DIAGNOSIS — R059 Cough, unspecified: Secondary | ICD-10-CM

## 2020-10-21 LAB — URINE CULTURE: Organism ID, Bacteria: NO GROWTH

## 2020-10-21 MED ORDER — BENZONATATE 100 MG PO CAPS: 100.0000 mg | ORAL_CAPSULE | Freq: Three times a day (TID) | ORAL | 0 refills | Status: DC

## 2020-10-21 NOTE — Discharge Instructions (Addendum)
I have sent in tessalon perles for you to use one capsule every 8 hours as needed for cough.  Your COVID and Influenza tests are pending. You should self quarantine until the test results are back.    Take Tylenol or ibuprofen as needed for fever or discomfort. Rest and keep yourself hydrated.    Follow-up with your primary care provider if your symptoms are not improving.     

## 2020-10-21 NOTE — ED Triage Notes (Addendum)
Cough and sinus congestion Friday night.  Neg covid home test

## 2020-10-21 NOTE — ED Provider Notes (Signed)
[place]   [id number] 10/21/20 Arrival Time: 6440   CC: COVID symptoms  SUBJECTIVE: History from: patient.  [name] is a 80 y.o. female who presents with cough, nasal congestion, headache for the last 3 days. Denies sick exposure to COVID, flu or strep. Denies recent travel. States negative home covid test. Has negative history of Covid. Has completed Covid vaccines and booster. Has taken Nyquil with some relief at night. There are no aggravating or alleviating factors. Denies previous symptoms in the past. Denies fever, chills, fatigue, sinus pain, rhinorrhea, sore throat, SOB, wheezing, chest pain, nausea, changes in bowel or bladder habits.    ROS: As per HPI.  All other pertinent ROS negative.     Past Medical History:  Diagnosis Date  . Current use of estrogen therapy 08/22/2014  . Dizziness 08/22/2014  . Endometriosis   . Hypertension 08/22/2014  . Hypothyroidism   . IBS (irritable bowel syndrome)   . Vitamin D deficiency    Past Surgical History:  Procedure Laterality Date  . ABDOMINAL HYSTERECTOMY    . BREAST BIOPSY    . BROW LIFT Bilateral 03/29/2020   Procedure: BLEPHAROPLASTY;  Surgeon: Baruch Goldmann, MD;  Location: [facility];  Service: Ophthalmology;  Laterality: Bilateral;  . BUNIONECTOMY  10/22/2011   Procedure: Lillard Anes;  Surgeon: Marcheta Grammes, DPM;  Location: [facility];  Service: Orthopedics;  Laterality: Right;  Austin Bunionectomy Right Foot  . CHOLECYSTECTOMY    . FOOT SURGERY    . HAMMER TOE SURGERY  10/22/2011   Procedure: HAMMER TOE CORRECTION;  Surgeon: Marcheta Grammes, DPM;  Location: [facility];  Service: Orthopedics;  Laterality: Right;  Arthroplasty 5th Toe Right Foot  . LAPAROSCOPIC APPENDECTOMY N/A 12/22/2013   Procedure: APPENDECTOMY LAPAROSCOPIC;  Surgeon: Jamesetta So, MD;  Location: [facility];  Service: General;  Laterality: N/A;  . METATARSAL OSTEOTOMY  10/22/2011   Procedure: METATARSAL OSTEOTOMY;  Surgeon: Marcheta Grammes, DPM;  Location: [facility];  Service: Orthopedics;  Laterality: Right;  Possible Aiken Osteotomy Right Foot  . THYROIDECTOMY     Allergies  Allergen Reactions  . Codeine Nausea And Vomiting  . Other Nausea And Vomiting    Any pain med; anesthesia   No current facility-administered medications on file prior to encounter.   Current Outpatient Medications on File Prior to Encounter  Medication Sig Dispense Refill  . B Complex-C (SUPER B COMPLEX PO) Take 1 capsule by mouth daily.     . cephALEXin (KEFLEX) 500 MG capsule Take 1 capsule (500 mg total) by mouth 3 (three) times daily. 21 capsule 0  . Cholecalciferol (VITAMIN D PO) Take 2,000 Units by mouth daily.     Marland Kitchen estradiol (CLIMARA - DOSED IN MG/24 HR) 0.05 mg/24hr patch APPLY ONE PATCH WEEKLY. (Patient taking differently: Place 0.05 mg onto the skin once a week.) 12 patch 4  . levothyroxine (SYNTHROID) 100 MCG tablet Take 100 mcg by mouth daily before breakfast.     . losartan (COZAAR) 50 MG tablet Take 50 mg by mouth daily.     . Multiple Vitamin (MULTIVITAMIN WITH MINERALS) TABS tablet Take 1 tablet by mouth daily.    . Multiple Vitamins-Minerals (PRESERVISION AREDS PO) Take 1 capsule by mouth in the morning and at bedtime.     Tiffany Kim-Propyl Kim (SYSTANE OP) Place 1 drop into both eyes 2 (two) times daily as needed (dry eyes).    . Pumpkin Seed-Soy Germ (AZO BLADDER CONTROL/GO-LESS PO) Take by mouth.    Marland Kitchen  sodium chloride (OCEAN) 0.65 % SOLN nasal spray Place 1 spray into both nostrils daily.     Social History   Socioeconomic History  . Marital status: Widowed    Spouse name: Not on file  . Number of children: 2  . Years of education: Not on file  . Highest education level: Not on file  Occupational History  . Not on file  Tobacco Use  . Smoking status: Former Smoker    Types: Cigarettes  . Smokeless tobacco: Never Used  Vaping Use  . Vaping Use: Never used  Substance and Sexual Activity  . Alcohol use:  Yes    Alcohol/week: 2.0 standard drinks    Types: 2 Glasses of wine per week    Comment: wine every night  . Drug use: No  . Sexual activity: Not Currently    Birth control/protection: Surgical    Comment: hyst  Other Topics Concern  . Not on file  Social History Narrative  . Not on file   Social Determinants of Health   Financial Resource Strain: Low Risk   . Difficulty of Paying Living Expenses: Not hard at all  Food Insecurity: No Food Insecurity  . Worried About Charity fundraiser in the Last Year: Never true  . Ran Out of Food in the Last Year: Never true  Transportation Needs: No Transportation Needs  . Lack of Transportation (Medical): No  . Lack of Transportation (Non-Medical): No  Physical Activity: Inactive  . Days of Exercise per Week: 0 days  . Minutes of Exercise per Session: 0 min  Stress: No Stress Concern Present  . Feeling of Stress : Only a little  Social Connections: Moderately Integrated  . Frequency of Communication with Friends and Family: More than three times a week  . Frequency of Social Gatherings with Friends and Family: Once a week  . Attends Religious Services: More than 4 times per year  . Active Member of Clubs or Organizations: Not on file  . Attends Archivist Meetings: Never  . Marital Status: Married  Human resources officer Violence: Not At Risk  . Fear of Current or Ex-Partner: No  . Emotionally Abused: No  . Physically Abused: No  . Sexually Abused: No   Family History  Problem Relation Age of Onset  . Heart attack Father        Age 50  . Heart attack Mother        Late 41s  . Cancer Mother        colon  . COPD Sister   . Hypertension Brother   . Breast cancer Sister   . Cancer Maternal Grandfather        pancreatic  . Stroke Paternal Grandmother     OBJECTIVE:  Vitals:   10/21/20 0832  BP: 129/83  Pulse: 76  Resp: 17  Temp: 97.7 F (36.5 C)  TempSrc: Oral  SpO2: 95%     General appearance: alert; appears  fatigued, but nontoxic; speaking in full sentences and tolerating own secretions HEENT: NCAT; Ears: EACs clear, TMs pearly gray; Eyes: PERRL.  EOM grossly intact. Sinuses: nontender; Nose: nares patent with clear rhinorrhea, Throat: oropharynx erythematous, cobblestoning present, tonsils non erythematous or enlarged, uvula midline  Neck: supple with LAD Lungs: unlabored respirations, symmetrical air entry; cough: mild; no respiratory distress; CTAB Heart: regular rate and rhythm.  Radial pulses 2+ symmetrical bilaterally Skin: warm and dry Psychological: alert and cooperative; normal mood and affect  LABS:  No results found  for this or any previous visit (from the past 24 hour(s)).   ASSESSMENT & PLAN:  1. Viral illness   2. Cough     Meds ordered this encounter  Medications  . benzonatate (TESSALON) 100 MG capsule    Sig: Take 1 capsule (100 mg total) by mouth every 8 (eight) hours.    Dispense:  21 capsule    Refill:  0    Order Specific Question:   Supervising Provider    Answer:   Chase Picket [[id number]]    Tessalon perles prescribed for cough Continue supportive care at home COVID and flu testing ordered.  It will take between 2-3 days for test results. Someone will contact you regarding abnormal results.   Patient should remain in quarantine until they have received Covid results.  If negative you may resume normal activities (go back to work/school) while practicing hand hygiene, social distance, and mask wearing.  If positive, patient should remain in quarantine for at least 5 days from symptom onset AND greater than 72 hours after symptoms resolution (absence of fever without the use of fever-reducing medication and improvement in respiratory symptoms), whichever is longer Get plenty of rest and push fluids Use OTC zyrtec for nasal congestion, runny nose, and/or sore throat Use OTC flonase for nasal congestion and runny nose Use medications daily for symptom  relief Use OTC medications like ibuprofen or tylenol as needed fever or pain Call or go to the ED if you have any new or worsening symptoms such as fever, worsening cough, shortness of breath, chest tightness, chest pain, turning blue, changes in mental status.  Reviewed expectations re: course of current medical issues. Questions answered. Outlined signs and symptoms indicating need for more acute intervention. Patient verbalized understanding. After Visit Summary given.         Faustino Congress, NP 10/21/20 [phone]

## 2020-10-22 LAB — COVID-19, FLU A+B NAA
Influenza A, NAA: NOT DETECTED
Influenza B, NAA: NOT DETECTED
SARS-CoV-2, NAA: NOT DETECTED

## 2020-11-20 DIAGNOSIS — H353132 Nonexudative age-related macular degeneration, bilateral, intermediate dry stage: Secondary | ICD-10-CM | POA: Diagnosis not present

## 2020-11-20 DIAGNOSIS — H01004 Unspecified blepharitis left upper eyelid: Secondary | ICD-10-CM | POA: Diagnosis not present

## 2020-11-20 DIAGNOSIS — H01001 Unspecified blepharitis right upper eyelid: Secondary | ICD-10-CM | POA: Diagnosis not present

## 2020-11-20 DIAGNOSIS — H01002 Unspecified blepharitis right lower eyelid: Secondary | ICD-10-CM | POA: Diagnosis not present

## 2020-11-25 DIAGNOSIS — I10 Essential (primary) hypertension: Secondary | ICD-10-CM | POA: Diagnosis not present

## 2020-11-25 DIAGNOSIS — E0590 Thyrotoxicosis, unspecified without thyrotoxic crisis or storm: Secondary | ICD-10-CM | POA: Diagnosis not present

## 2020-11-28 DIAGNOSIS — K5909 Other constipation: Secondary | ICD-10-CM | POA: Diagnosis not present

## 2020-11-28 DIAGNOSIS — E785 Hyperlipidemia, unspecified: Secondary | ICD-10-CM | POA: Diagnosis not present

## 2020-11-28 DIAGNOSIS — I10 Essential (primary) hypertension: Secondary | ICD-10-CM | POA: Diagnosis not present

## 2020-11-28 DIAGNOSIS — E0590 Thyrotoxicosis, unspecified without thyrotoxic crisis or storm: Secondary | ICD-10-CM | POA: Diagnosis not present

## 2020-12-23 ENCOUNTER — Telehealth: Payer: Self-pay | Admitting: Adult Health

## 2020-12-23 NOTE — Telephone Encounter (Signed)
Patient called stating that she would like to speak with Anderson Malta, Patient did not state the reason for the call she just states that she would like a call back. Please contact pt

## 2020-12-23 NOTE — Telephone Encounter (Signed)
Pt called and has urinary frequency and feels like bladder dropped, has been moving and lifting, to come in Thursday at 12 N

## 2020-12-26 ENCOUNTER — Encounter: Payer: Self-pay | Admitting: Adult Health

## 2020-12-26 ENCOUNTER — Ambulatory Visit (INDEPENDENT_AMBULATORY_CARE_PROVIDER_SITE_OTHER): Payer: Medicare Other | Admitting: Adult Health

## 2020-12-26 ENCOUNTER — Other Ambulatory Visit: Payer: Self-pay

## 2020-12-26 VITALS — BP 119/76 | HR 61 | Ht 63.5 in | Wt 118.8 lb

## 2020-12-26 DIAGNOSIS — N8111 Cystocele, midline: Secondary | ICD-10-CM | POA: Diagnosis not present

## 2020-12-26 DIAGNOSIS — R350 Frequency of micturition: Secondary | ICD-10-CM | POA: Diagnosis not present

## 2020-12-26 DIAGNOSIS — N816 Rectocele: Secondary | ICD-10-CM

## 2020-12-26 DIAGNOSIS — N812 Incomplete uterovaginal prolapse: Secondary | ICD-10-CM | POA: Insufficient documentation

## 2020-12-26 NOTE — Progress Notes (Signed)
  Subjective:     Patient ID: Tiffany Kim, female   DOB: 10/21/1940, 80 y.o.   MRN: [id number]  HPI Tiffany Kim is a 80 year old white female, widowed, sp hysterectomy in complaining of urinary frequency and something has dropped. She has moved recently and had been doing a lot of lifting and has lost about 10 lbs since May. PCP is Dr Nevada Crane.  Review of Systems Has to void frequently, about 3 x at night too +something has dropped Reviewed past medical,surgical, social and family history. Reviewed medications and allergies.     Objective:   Physical Exam BP 119/76 (BP Location: Left Arm, Patient Position: Sitting, Cuff Size: Normal)   Pulse 61   Ht 5' 3.5" (1.613 m)   Wt 118 lb 12.8 oz (53.9 kg)   BMI 20.71 kg/m     Skin warm and dry.Pelvic: external genitalia is normal in appearance no lesions, vagina: pale with +cystocele and vaginal prolapse,urethra has no lesions or masses noted, cervix and uterus absent,adnexa: no masses or tenderness noted. Bladder is non tender and no masses felt.On rectal exam no masses felt, has +rectocele.  Examination chaperoned by Celene Squibb LPN  Assessment:     1. 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 Rectocele  3. Urinary frequency     Plan:     Return in 8 days for pessary fitting

## 2021-01-03 ENCOUNTER — Telehealth: Payer: Self-pay | Admitting: *Deleted

## 2021-01-03 ENCOUNTER — Other Ambulatory Visit: Payer: Self-pay

## 2021-01-03 ENCOUNTER — Encounter: Payer: Self-pay | Admitting: Obstetrics & Gynecology

## 2021-01-03 ENCOUNTER — Ambulatory Visit (INDEPENDENT_AMBULATORY_CARE_PROVIDER_SITE_OTHER): Payer: Medicare Other | Admitting: Obstetrics & Gynecology

## 2021-01-03 VITALS — BP 136/81 | HR 65 | Ht 63.5 in | Wt 119.0 lb

## 2021-01-03 DIAGNOSIS — Z4689 Encounter for fitting and adjustment of other specified devices: Secondary | ICD-10-CM

## 2021-01-03 DIAGNOSIS — N8110 Cystocele, unspecified: Secondary | ICD-10-CM | POA: Diagnosis not present

## 2021-01-03 DIAGNOSIS — N993 Prolapse of vaginal vault after hysterectomy: Secondary | ICD-10-CM | POA: Diagnosis not present

## 2021-01-03 NOTE — Progress Notes (Signed)
Chief Complaint  Patient presents with   Pessary Fitting     Blood pressure 136/81, pulse 65, height 5' 3.5" (1.613 m), weight 119 lb (54 kg).  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  Vaginal Epithelial Abnormality Classification System:   0 0    No abnormalities 1    Epithelial erythema 2    Granulation tissue 3    Epithelial break or erosion, 1 cm or less 4    Epithelial break or erosion, 1 cm or greater   She is fit today for Milex ring with support #3, with good anatomical result and good patient tolerance and comfort There is one in stock which is placed for her today   The pessary is removed, cleaned and replaced without difficulty.      ICD-10-CM   1. Encounter for fitting and adjustment of pessary  Z46.89    Fit today for Milex ring with support #3    2. 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 POP-Q stage 4 cystocele  N81.10       She may experience new stress related incontinence with her kink out of the urethra Hopefully the frequency will be improved   Tiffany Kim will be sen back in 1 months for initial follow up.  Florian Buff, MD  01/03/2021 9:35 AM

## 2021-01-03 NOTE — Telephone Encounter (Signed)
Patient called and stated that pessary fell out. I informed Dr. Elonda Husky and he said that he will come to the office Tuesday and try another size. Patient will be called on Tuesday to be told what time to come see him. She is also aware to bring pessary with her.

## 2021-01-09 ENCOUNTER — Telehealth: Payer: Self-pay | Admitting: Adult Health

## 2021-01-09 NOTE — Telephone Encounter (Signed)
Patient called stating that she would like to speak with Anderson Malta, Patient sis not state the reason for the call. Please contact pt

## 2021-01-09 NOTE — Telephone Encounter (Signed)
Purpose said she had pessary placed last Friday, felt better, and then went to grocery and it feel out, and called back but Dr Elonda Husky had left and was told they would call her back Tuesday but didn't;t, will get her in on Monday to see Dr Elonda Husky, will need different size pessary.

## 2021-01-13 ENCOUNTER — Ambulatory Visit (INDEPENDENT_AMBULATORY_CARE_PROVIDER_SITE_OTHER): Payer: Medicare Other | Admitting: Obstetrics & Gynecology

## 2021-01-13 ENCOUNTER — Other Ambulatory Visit: Payer: Self-pay

## 2021-01-13 ENCOUNTER — Telehealth: Payer: Self-pay | Admitting: Obstetrics & Gynecology

## 2021-01-13 ENCOUNTER — Encounter: Payer: Self-pay | Admitting: Obstetrics & Gynecology

## 2021-01-13 VITALS — BP 130/81 | HR 66 | Ht 63.0 in | Wt 120.5 lb

## 2021-01-13 DIAGNOSIS — N8110 Cystocele, unspecified: Secondary | ICD-10-CM | POA: Diagnosis not present

## 2021-01-13 DIAGNOSIS — N993 Prolapse of vaginal vault after hysterectomy: Secondary | ICD-10-CM

## 2021-01-13 NOTE — Telephone Encounter (Signed)
I spoke with Dr. Elonda Husky. Pt needs to come back in. Terrence Dupont to notify pt. [place]

## 2021-01-13 NOTE — Progress Notes (Addendum)
Chief Complaint  Patient presents with   pessary fell out    Blood pressure 130/81, pulse 66, height '5\' 3"'$  (1.6 m), weight 120 lb 8 oz (54.7 kg).  Tiffany Kim presents today for continued management of her vaginal vault prolpase and cytocoele.She was fit for Milex ring #3 but it came out about 4 hours later in the [employer] on [address]. She reports no vaginal discharge and no vaginal bleeding   Likert scale(1 not bothersome -5 very bothersome)  :  1  Exam reveals no undue vaginal mucosal pressure of breakdown, no discharge and no vaginal bleeding.  Vaginal Epithelial Abnormality Classification System:   0 0    No abnormalities 1    Epithelial erythema 2    Granulation tissue 3    Epithelial break or erosion, 1 cm or less 4    Epithelial break or erosion, 1 cm or greater  The pessary is refit for Milex ring with support #4, if this doesn't work then refit with a Gelhorn    ICD-10-CM   1. Vaginal vault prolapse after hysterectomy, total  N99.3     2. POP-Q stage 4 cystocele  N81.10        MCKYNLEIGH RIM will be sen back in 1 months for continued follow up.  Florian Buff, MD  01/13/2021 9:51 AM  Pt called back and the #4 had fallen out at the office, stayed out for about 4 hours.  I told her if it came out we would use the Gelhorn.  She is fit for the 2 1/2 inhc, the 2 3/4 I is a little too large.  We will have to order it.  Florian Buff, MD 01/13/2021 3:26 PM

## 2021-01-13 NOTE — Telephone Encounter (Signed)
Patient calling stating that her pessary came out again.

## 2021-01-21 ENCOUNTER — Other Ambulatory Visit: Payer: Self-pay

## 2021-01-21 ENCOUNTER — Encounter: Payer: Self-pay | Admitting: Obstetrics & Gynecology

## 2021-01-21 ENCOUNTER — Ambulatory Visit (INDEPENDENT_AMBULATORY_CARE_PROVIDER_SITE_OTHER): Payer: Medicare Other | Admitting: Obstetrics & Gynecology

## 2021-01-21 VITALS — BP 146/79 | HR 71 | Ht 63.0 in | Wt 120.0 lb

## 2021-01-21 DIAGNOSIS — N8111 Cystocele, midline: Secondary | ICD-10-CM

## 2021-01-21 DIAGNOSIS — N8110 Cystocele, unspecified: Secondary | ICD-10-CM | POA: Diagnosis not present

## 2021-01-21 DIAGNOSIS — Z4689 Encounter for fitting and adjustment of other specified devices: Secondary | ICD-10-CM

## 2021-01-21 NOTE — Progress Notes (Signed)
Chief Complaint  Patient presents with   Pessary Check    Blood pressure (!) 146/79, pulse 71, height '5\' 3"'$  (1.6 m), weight 120 lb (54.4 kg).  Tiffany Kim presents today for routine follow up related to her pessary.   She was fit for a Gelhorn 2 1/2 inch last week and it came in yesterday She reports no vaginal discharge and no vaginal bleeding   Likert scale(1 not bothersome -5 very bothersome)  :  1  Exam reveals no undue vaginal mucosal pressure of breakdown, no discharge and no vaginal bleeding.  Vaginal Epithelial Abnormality Classification System:   0 0    No abnormalities 1    Epithelial erythema 2    Granulation tissue 3    Epithelial break or erosion, 1 cm or less 4    Epithelial break or erosion, 1 cm or greater  The pessary is removed, cleaned and replaced without difficulty.      ICD-10-CM   1. Pessary maintenance, Gelhorn 2 1/2 inch placed today  Z46.89    Failed 2 sizes Milex ring pessaries    2. Prolapse of vaginal wall with midline cystocele  N81.11     3. POP-Q stage 4 cystocele  N81.10        Tiffany Kim will be sen back in keep scheduled months for continued follow up.  Florian Buff, MD  01/21/2021 3:15 PM

## 2021-02-03 ENCOUNTER — Ambulatory Visit: Payer: Medicare Other | Admitting: Obstetrics & Gynecology

## 2021-02-10 ENCOUNTER — Encounter: Payer: Medicare Other | Admitting: Obstetrics & Gynecology

## 2021-02-18 ENCOUNTER — Other Ambulatory Visit: Payer: Self-pay

## 2021-02-18 ENCOUNTER — Encounter: Payer: Self-pay | Admitting: Obstetrics & Gynecology

## 2021-02-18 ENCOUNTER — Ambulatory Visit (INDEPENDENT_AMBULATORY_CARE_PROVIDER_SITE_OTHER): Payer: Medicare Other | Admitting: Obstetrics & Gynecology

## 2021-02-18 VITALS — BP 137/68 | HR 67 | Wt 119.5 lb

## 2021-02-18 DIAGNOSIS — N3941 Urge incontinence: Secondary | ICD-10-CM

## 2021-02-18 DIAGNOSIS — N993 Prolapse of vaginal vault after hysterectomy: Secondary | ICD-10-CM | POA: Diagnosis not present

## 2021-02-18 DIAGNOSIS — Z4689 Encounter for fitting and adjustment of other specified devices: Secondary | ICD-10-CM | POA: Diagnosis not present

## 2021-02-18 NOTE — Progress Notes (Signed)
Chief Complaint  Patient presents with   Pessary Maintenance    Blood pressure 137/68, pulse 67, weight 119 lb 8 oz (54.2 kg).  Tiffany Kim presents today for routine follow up related to her pessary.   She uses a Gelhorn pessary 2 1/2 inch,placed 01/21/21 She is without complaints today specifically or in general She reports no vaginal discharge and no vaginal bleeding   Likert scale(1 not bothersome -5 very bothersome)  :  1  Exam reveals no undue vaginal mucosal pressure of breakdown, no discharge and no vaginal bleeding.  Vaginal Epithelial Abnormality Classification System:   0 0    No abnormalities 1    Epithelial erythema 2    Granulation tissue 3    Epithelial break or erosion, 1 cm or less 4    Epithelial break or erosion, 1 cm or greater  The pessary is removed, cleaned and replaced without difficulty.      ICD-10-CM   1. Pessary maintenance, Gelhorn 2 1/2 inch placed 01/21/21  Z46.89    Doing well, no mucosal breakdown, no patient complaints    2. Vaginal vault prolapse after hysterectomy, total  N99.3     3. Urge incontinence  N39.41    De novo since pessary, not enough for medication, recommend topical zinc oxide       CACI WANSER will be sen back in 3 months for continued follow up.  Florian Buff, MD  02/18/2021 9:42 AM

## 2021-03-28 DIAGNOSIS — Z23 Encounter for immunization: Secondary | ICD-10-CM | POA: Diagnosis not present

## 2021-04-11 DIAGNOSIS — Z23 Encounter for immunization: Secondary | ICD-10-CM | POA: Diagnosis not present

## 2021-04-29 ENCOUNTER — Other Ambulatory Visit: Payer: Self-pay | Admitting: Adult Health

## 2021-05-20 ENCOUNTER — Ambulatory Visit (INDEPENDENT_AMBULATORY_CARE_PROVIDER_SITE_OTHER): Payer: Medicare Other | Admitting: Adult Health

## 2021-05-20 ENCOUNTER — Encounter: Payer: Self-pay | Admitting: Adult Health

## 2021-05-20 ENCOUNTER — Other Ambulatory Visit: Payer: Self-pay

## 2021-05-20 VITALS — BP 144/74 | HR 65 | Ht 62.5 in | Wt 121.0 lb

## 2021-05-20 DIAGNOSIS — N993 Prolapse of vaginal vault after hysterectomy: Secondary | ICD-10-CM | POA: Diagnosis not present

## 2021-05-20 DIAGNOSIS — Z4689 Encounter for fitting and adjustment of other specified devices: Secondary | ICD-10-CM | POA: Diagnosis not present

## 2021-05-20 DIAGNOSIS — N8110 Cystocele, unspecified: Secondary | ICD-10-CM | POA: Diagnosis not present

## 2021-05-20 NOTE — Progress Notes (Signed)
  Subjective:     Patient ID: Tiffany Kim, female   DOB: Jul 15, 1940, 80 y.o.   MRN: [id number]  HPI Tiffany Kim is a 80 year old white female, widowed, sp hysterectomy in for pessary maintenance. She is happy with this pessary.  PCP is Dr Tiffany Kim  Review of Systems Denies any bleeding,discharge or odor Leaks urine, at times, just wears a pad. Reviewed past medical,surgical, social and family history. Reviewed medications and allergies.     Objective:   Physical Exam BP (!) 144/74 (BP Location: Right Arm, Patient Position: Sitting, Cuff Size: Normal)   Pulse 65   Ht 5' 2.5" (1.588 m)   Wt 121 lb (54.9 kg)   BMI 21.78 kg/m     Skin warm and dry.Pelvic: external genitalia is normal in appearance no lesions, vagina: pessary removed, vaginal tissues pale, no lesions, +cystocele and vault prolapse,urethra has no lesions or masses noted, cervix and uterus are absent, adnexa: no masses or tenderness noted. Bladder is non tender and no masses felt. Pessary cleaned with soap and water and easily reinserted.  Examination chaperoned by Tiffany Squibb LPN  Assessment:     1. Pessary maintenance, Gelhorn 2 1/2 inch placed 01/21/21  2. Vaginal vault prolapse after hysterectomy, total  3. POP-Q stage 4 cystocele     Plan:     Follow up in 4 months for pessary maintenance

## 2021-05-22 DIAGNOSIS — H01001 Unspecified blepharitis right upper eyelid: Secondary | ICD-10-CM | POA: Diagnosis not present

## 2021-05-22 DIAGNOSIS — H01002 Unspecified blepharitis right lower eyelid: Secondary | ICD-10-CM | POA: Diagnosis not present

## 2021-05-22 DIAGNOSIS — H353132 Nonexudative age-related macular degeneration, bilateral, intermediate dry stage: Secondary | ICD-10-CM | POA: Diagnosis not present

## 2021-05-30 DIAGNOSIS — E0590 Thyrotoxicosis, unspecified without thyrotoxic crisis or storm: Secondary | ICD-10-CM | POA: Diagnosis not present

## 2021-05-30 DIAGNOSIS — I10 Essential (primary) hypertension: Secondary | ICD-10-CM | POA: Diagnosis not present

## 2021-06-03 DIAGNOSIS — E785 Hyperlipidemia, unspecified: Secondary | ICD-10-CM | POA: Diagnosis not present

## 2021-06-03 DIAGNOSIS — K5909 Other constipation: Secondary | ICD-10-CM | POA: Diagnosis not present

## 2021-06-03 DIAGNOSIS — L853 Xerosis cutis: Secondary | ICD-10-CM | POA: Diagnosis not present

## 2021-06-03 DIAGNOSIS — D7589 Other specified diseases of blood and blood-forming organs: Secondary | ICD-10-CM | POA: Diagnosis not present

## 2021-06-03 DIAGNOSIS — I10 Essential (primary) hypertension: Secondary | ICD-10-CM | POA: Diagnosis not present

## 2021-06-03 DIAGNOSIS — M6281 Muscle weakness (generalized): Secondary | ICD-10-CM | POA: Diagnosis not present

## 2021-06-03 DIAGNOSIS — E039 Hypothyroidism, unspecified: Secondary | ICD-10-CM | POA: Diagnosis not present

## 2021-06-27 DIAGNOSIS — Z1152 Encounter for screening for COVID-19: Secondary | ICD-10-CM | POA: Diagnosis not present

## 2021-06-27 DIAGNOSIS — Z20822 Contact with and (suspected) exposure to covid-19: Secondary | ICD-10-CM | POA: Diagnosis not present

## 2021-08-09 ENCOUNTER — Ambulatory Visit
Admission: EM | Admit: 2021-08-09 | Discharge: 2021-08-09 | Disposition: A | Discharge status: Home / Self Care | Payer: Medicare Other | Attending: Family Medicine | Admitting: Family Medicine

## 2021-08-09 ENCOUNTER — Other Ambulatory Visit: Payer: Self-pay

## 2021-08-09 ENCOUNTER — Ambulatory Visit (INDEPENDENT_AMBULATORY_CARE_PROVIDER_SITE_OTHER): Payer: Medicare Other

## 2021-08-09 DIAGNOSIS — J4521 Mild intermittent asthma with (acute) exacerbation: Secondary | ICD-10-CM | POA: Diagnosis not present

## 2021-08-09 DIAGNOSIS — R059 Cough, unspecified: Secondary | ICD-10-CM

## 2021-08-09 DIAGNOSIS — J069 Acute upper respiratory infection, unspecified: Secondary | ICD-10-CM | POA: Diagnosis not present

## 2021-08-09 DIAGNOSIS — R0602 Shortness of breath: Secondary | ICD-10-CM

## 2021-08-09 DIAGNOSIS — J209 Acute bronchitis, unspecified: Secondary | ICD-10-CM

## 2021-08-09 MED ORDER — ALBUTEROL SULFATE HFA 108 (90 BASE) MCG/ACT IN AERS
2.0000 | INHALATION_SPRAY | Freq: Once | RESPIRATORY_TRACT | Status: AC
  Administered 2021-08-09: 2 via RESPIRATORY_TRACT

## 2021-08-09 MED ORDER — DEXAMETHASONE SODIUM PHOSPHATE 10 MG/ML IJ SOLN
10.0000 mg | Freq: Once | INTRAMUSCULAR | Status: AC
  Administered 2021-08-09: 10 mg via INTRAMUSCULAR

## 2021-08-09 NOTE — ED Provider Notes (Signed)
[facility]    CSN: [id number] Arrival date & time: 08/09/21  1151      History   Chief Complaint Chief Complaint  Patient presents with   Cough    Chest congestion and cough    HPI Tiffany Kim is a 81 y.o. female.   Presenting today with 2-day history of nasal congestion, rhinorrhea, shortness of breath, chest tightness, wheezing.  She denies fever, chills, body aches, chest pain, abdominal pain, nausea vomiting or diarrhea.  Taking NyQuil, Mucinex, Tessalon with minimal relief.  States she tends to get bronchitis anytime she gets sick.  No known sick contacts recently.  No known history of chronic pulmonary disease.   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 12/26/2020   Rectocele 12/26/2020   Prolapse of vaginal wall with midline cystocele 12/26/2020   Hematuria 10/18/2020   Urinary frequency 10/18/2020   Mixed stress and urge urinary incontinence 08/12/2016   Primary osteoarthritis of first carpometacarpal joint of right hand 11/20/2015   Dizziness 08/22/2014   Hypertension 08/22/2014   Current use of estrogen therapy 08/22/2014   Elevated blood pressure 08/28/2013   Precordial pain 08/25/2013   HYPOTHYROIDISM 11/05/2008    Past Surgical History:  Procedure Laterality Date   ABDOMINAL HYSTERECTOMY     BREAST BIOPSY     BROW LIFT Bilateral 03/29/2020   Procedure: BLEPHAROPLASTY;  Surgeon: Baruch Goldmann, MD;  Location: [facility];  Service: Ophthalmology;  Laterality: Bilateral;    BUNIONECTOMY  10/22/2011   Procedure: Lillard Anes;  Surgeon: Marcheta Grammes, DPM;  Location: [facility];  Service: Orthopedics;  Laterality: Right;  [place] Bunionectomy Right Foot   CHOLECYSTECTOMY     FOOT SURGERY     HAMMER TOE SURGERY  10/22/2011   Procedure: HAMMER TOE CORRECTION;  Surgeon: Marcheta Grammes, DPM;  Location: [facility];  Service: Orthopedics;  Laterality: Right;  Arthroplasty 5th Toe Right Foot   LAPAROSCOPIC APPENDECTOMY N/A 12/22/2013   Procedure: APPENDECTOMY LAPAROSCOPIC;  Surgeon: Jamesetta So, MD;  Location: [facility];  Service: General;  Laterality: N/A;   METATARSAL OSTEOTOMY  10/22/2011   Procedure: METATARSAL OSTEOTOMY;  Surgeon: Marcheta Grammes, DPM;  Location: [facility];  Service: Orthopedics;  Laterality: Right;  Possible Aiken Osteotomy Right Foot   THYROIDECTOMY      OB History     Gravida  2   Para  2   Term  2   Preterm      AB      Living  2      SAB      IAB      Ectopic      Multiple      Live Births  2            Home Medications    Prior to Admission medications   Medication Sig Start Date End Date Taking? Authorizing Provider  B Complex-C (SUPER B COMPLEX PO) Take 1 capsule by mouth daily.     [provider]  Cholecalciferol (VITAMIN D PO) Take 2,000 Units by mouth daily.     [provider]  estradiol (CLIMARA - DOSED IN MG/24 HR) 0.05 mg/24hr patch APPLY ONE PATCH WEEKLY. 04/29/21   Estill Dooms, NP  levothyroxine (SYNTHROID) 88 MCG tablet Take 88 mcg by mouth daily. 01/28/21   [provider]  LINZESS 145 MCG CAPS capsule Take 145 mcg by mouth daily. 12/23/20   [provider]  losartan (COZAAR) 50 MG tablet Take 50 mg by mouth daily.  07/16/16   [provider]  Multiple Vitamin (MULTIVITAMIN WITH MINERALS) TABS tablet Take 1 tablet by mouth daily.    [provider]  Multiple Vitamins-Minerals (PRESERVISION AREDS PO) Take 1 capsule by mouth in the  morning and at bedtime.     [provider]  Polyethyl Glycol-Propyl Glycol (SYSTANE OP) Place 1 drop into both eyes 2 (two) times daily as needed (dry eyes).    [provider]  Pumpkin Seed-Soy Germ (AZO BLADDER CONTROL/GO-LESS PO) Take by mouth.    [provider]  sodium chloride (OCEAN) 0.65 % SOLN nasal spray Place 1 spray into both nostrils daily.    [provider]    Family History Family History  Problem Relation Age of Onset   Heart attack Father        Age 58   Heart attack Mother        Late 50s   Cancer Mother        colon   COPD Sister    Hypertension Brother    Breast cancer Sister    Cancer Maternal Grandfather        pancreatic   Stroke Paternal Grandmother     Social History Social History   Tobacco Use   Smoking status: Former    Types: Cigarettes   Smokeless tobacco: Never  Vaping Use   Vaping Use: Never used  Substance Use Topics   Alcohol use: Yes    Alcohol/week: 2.0 standard drinks    Types: 2 Glasses of wine per week    Comment: wine every night   Drug use: No     Allergies   Codeine and Other   Review of Systems Review of Systems Per HPI  Physical Exam Triage Vital Signs ED Triage Vitals  Enc Vitals Group     BP 08/09/21 1325 (!) 144/81     Pulse Rate 08/09/21 1325 80     Resp 08/09/21 1325 16     Temp 08/09/21 1325 97.9 F (36.6 C)     Temp Source 08/09/21 1325 Oral     SpO2 08/09/21 1325 92 %     Weight --      Height --      Head Circumference --      Peak Flow --      Pain Score 08/09/21 1323 3     Pain Loc --      Pain Edu? --      Excl. in [place]? --    No data found.  Updated Vital Signs BP (!) 144/81 (BP Location: Right Arm)    Pulse 80    Temp 97.9 F (36.6 C) (Oral)    Resp 16    SpO2 94%   Visual Acuity Right Eye Distance:   Left Eye Distance:   Bilateral Distance:    Right Eye Near:   Left Eye Near:    Bilateral Near:     Physical Exam Vitals and nursing note  reviewed.  Constitutional:      Appearance: Normal appearance.  HENT:     Head: Atraumatic.     Right Ear: Tympanic membrane and external ear normal.     Left Ear: Tympanic membrane and external ear normal.     Nose: Rhinorrhea present.     Mouth/Throat:     Mouth: Mucous membranes are moist.     Pharynx: Posterior oropharyngeal erythema present.  Eyes:     Extraocular Movements: Extraocular movements intact.     Conjunctiva/sclera: Conjunctivae normal.  Cardiovascular:     Rate and Rhythm: Normal rate and regular rhythm.     Heart sounds: Normal heart sounds.  Pulmonary:     Effort: Pulmonary effort is normal.     Breath sounds: Wheezing present. No rales.     Comments: In no distress, however slight struggle with speaking in full sentences.  Moderate diffuse wheezes bilaterally.  This is completely resolved after IM Decadron, 2 puffs of albuterol upon recheck. Musculoskeletal:        General: Normal range of motion.     Cervical back: Normal range of motion and neck supple.  Skin:    General: Skin is warm and dry.  Neurological:     Mental Status: She is alert and oriented to person, place, and time.     Motor: No weakness.     Gait: Gait normal.  Psychiatric:        Mood and Affect: Mood normal.        Thought Content: Thought content normal.     UC Treatments / Results  Labs (all labs ordered are listed, but only abnormal results are displayed) Labs Reviewed - No data to display  EKG   Radiology DG Chest 2 View  Result Date: 08/09/2021 CLINICAL DATA:  Productive cough, shortness of breath EXAM: CHEST - 2 VIEW COMPARISON:  None. FINDINGS: The heart size and mediastinal contours are within normal limits. Atherosclerotic calcification of the aortic knob. No focal airspace consolidation, pleural effusion, or pneumothorax. The visualized skeletal structures are unremarkable. IMPRESSION: No active cardiopulmonary disease. Electronically Signed   By: Davina Poke D.O.    On: 08/09/2021 14:27    Procedures Procedures (including critical care time)  Medications Ordered in UC Medications  dexamethasone (DECADRON) injection 10 mg (10 mg Intramuscular Given 08/09/21 1426)  albuterol (VENTOLIN HFA) 108 (90 Base) MCG/ACT inhaler 2 puff (2 puffs Inhalation Given 08/09/21 1426)    Initial Impression / Assessment and Plan / UC Course  I have reviewed the triage vital signs and the nursing notes.  Pertinent labs & imaging results that were available during my care of the patient were reviewed by me and considered in my medical decision making (see chart for details).     Oxygen saturation initially 91 to 92% on room air, improved to 94% with significant benefit symptomatically and on exam after albuterol and Decadron.  Chest x-ray negative for pneumonia.  Declines viral testing today.  Discussed continued albuterol as needed, Mucinex, other supportive home care and return precautions.  Final Clinical Impressions(s) / UC Diagnoses   Final diagnoses:  Viral URI with cough  Acute bronchitis, unspecified organism  Mild intermittent reactive airway disease with acute exacerbation   Discharge Instructions   None    ED Prescriptions   None    PDMP not reviewed this encounter.   Volney American, [place] 08/09/21 1507

## 2021-08-09 NOTE — ED Triage Notes (Signed)
Patient states that on Thursday morning she woke up with a head cold and her nose was dripping  Patient states when she laid down it was hard for her to breathe.  Patient statesshe tried some nyquil to help sleep and benzonatate 100mg  for the cough and Mucinex  Patient states that she done a home Covid test yesterday and it was negative  Denies Fever

## 2021-08-30 DIAGNOSIS — Z20828 Contact with and (suspected) exposure to other viral communicable diseases: Secondary | ICD-10-CM | POA: Diagnosis not present

## 2021-09-05 DIAGNOSIS — Z23 Encounter for immunization: Secondary | ICD-10-CM | POA: Diagnosis not present

## 2021-09-08 DIAGNOSIS — Z20822 Contact with and (suspected) exposure to covid-19: Secondary | ICD-10-CM | POA: Diagnosis not present

## 2021-09-12 DIAGNOSIS — Z20822 Contact with and (suspected) exposure to covid-19: Secondary | ICD-10-CM | POA: Diagnosis not present

## 2021-09-13 DIAGNOSIS — Z20828 Contact with and (suspected) exposure to other viral communicable diseases: Secondary | ICD-10-CM | POA: Diagnosis not present

## 2021-09-18 ENCOUNTER — Ambulatory Visit (INDEPENDENT_AMBULATORY_CARE_PROVIDER_SITE_OTHER): Payer: Medicare Other | Admitting: Adult Health

## 2021-09-18 ENCOUNTER — Encounter: Payer: Self-pay | Admitting: Adult Health

## 2021-09-18 VITALS — BP 115/72 | HR 67 | Ht 63.5 in | Wt 121.0 lb

## 2021-09-18 DIAGNOSIS — N8110 Cystocele, unspecified: Secondary | ICD-10-CM | POA: Diagnosis not present

## 2021-09-18 DIAGNOSIS — Z4689 Encounter for fitting and adjustment of other specified devices: Secondary | ICD-10-CM

## 2021-09-18 DIAGNOSIS — N993 Prolapse of vaginal vault after hysterectomy: Secondary | ICD-10-CM | POA: Diagnosis not present

## 2021-09-18 NOTE — Progress Notes (Signed)
?  Subjective:  ?  ? Patient ID: Tiffany Kim, female   DOB: 1940-10-13, 81 y.o.   MRN: [id number] ? ?HPI ?Tiffany Kim is a 81 year old white female, widowed, sp hysterectomy in for pessary maintenance. She is happy with the pessary. ?PCP is Dr Nevada Crane. ? ?Review of Systems ?Denies any vaginal discharge, or bleeding or odor ?Reviewed past medical,surgical, social and family history. Reviewed medications and allergies.  ?   ?Objective:  ? Physical Exam ?BP 115/72 (BP Location: Left Arm, Patient Position: Sitting, Cuff Size: Normal)   Pulse 67   Ht 5' 3.5" (1.613 m)   Wt 121 lb (54.9 kg)   BMI 21.10 kg/m?   ?  Skin warm and dry.Pelvic: external genitalia is normal in appearance no lesions, vagina: pessary removed, vaginal tissues pale, no lesions, +cystocele and vault prolapse,urethra has no lesions or masses noted, cervix and uterus are absent, adnexa: no masses or tenderness noted. Bladder is non tender and no masses felt. Pessary cleaned with soap and water and easily reinserted.  ?Examination chaperoned by Levy Pupa  LPN ?Fall risk is low ? [facility] - 09/18/21 0930   ? ?  ? Pregnancy Intention Screening  ? Does the patient want to become pregnant in the next year? N/A   ? Does the patient's partner want to become pregnant in the next year? N/A   ? Would the patient like to discuss contraceptive options today? N/A   ?  ? Contraception Wrap Up  ? Current Method Female Sterilization   hyst  ? End Method Female Sterilization   hyst  ? Contraception Counseling Provided No   ? ?  ?  ? ?  ?  ?Assessment:  ?   ?1. Pessary maintenance, Gelhorn 2 1/2 inch placed 01/21/21 ?She is happy with the pessary ? ?2. Vaginal vault prolapse after hysterectomy, total ? ? ?3. POP-Q stage 4 cystocele ? ?   ?Plan:  ?   ?Return in 4 months for pessary maintenance ?   ?

## 2021-09-26 DIAGNOSIS — Z20822 Contact with and (suspected) exposure to covid-19: Secondary | ICD-10-CM | POA: Diagnosis not present

## 2021-09-29 DIAGNOSIS — Z20822 Contact with and (suspected) exposure to covid-19: Secondary | ICD-10-CM | POA: Diagnosis not present

## 2021-09-30 DIAGNOSIS — Z20822 Contact with and (suspected) exposure to covid-19: Secondary | ICD-10-CM | POA: Diagnosis not present

## 2021-10-13 DIAGNOSIS — Z20822 Contact with and (suspected) exposure to covid-19: Secondary | ICD-10-CM | POA: Diagnosis not present

## 2021-10-14 DIAGNOSIS — Z20822 Contact with and (suspected) exposure to covid-19: Secondary | ICD-10-CM | POA: Diagnosis not present

## 2021-10-20 DIAGNOSIS — Z20822 Contact with and (suspected) exposure to covid-19: Secondary | ICD-10-CM | POA: Diagnosis not present

## 2021-11-20 DIAGNOSIS — H353132 Nonexudative age-related macular degeneration, bilateral, intermediate dry stage: Secondary | ICD-10-CM | POA: Diagnosis not present

## 2021-11-20 DIAGNOSIS — H01002 Unspecified blepharitis right lower eyelid: Secondary | ICD-10-CM | POA: Diagnosis not present

## 2021-11-20 DIAGNOSIS — H01001 Unspecified blepharitis right upper eyelid: Secondary | ICD-10-CM | POA: Diagnosis not present

## 2021-11-24 DIAGNOSIS — E559 Vitamin D deficiency, unspecified: Secondary | ICD-10-CM | POA: Diagnosis not present

## 2021-11-24 DIAGNOSIS — E0590 Thyrotoxicosis, unspecified without thyrotoxic crisis or storm: Secondary | ICD-10-CM | POA: Diagnosis not present

## 2021-11-24 DIAGNOSIS — N951 Menopausal and female climacteric states: Secondary | ICD-10-CM | POA: Diagnosis not present

## 2021-11-24 DIAGNOSIS — E785 Hyperlipidemia, unspecified: Secondary | ICD-10-CM | POA: Diagnosis not present

## 2021-12-10 DIAGNOSIS — K5909 Other constipation: Secondary | ICD-10-CM | POA: Diagnosis not present

## 2021-12-10 DIAGNOSIS — I10 Essential (primary) hypertension: Secondary | ICD-10-CM | POA: Diagnosis not present

## 2021-12-10 DIAGNOSIS — D7589 Other specified diseases of blood and blood-forming organs: Secondary | ICD-10-CM | POA: Diagnosis not present

## 2021-12-10 DIAGNOSIS — E785 Hyperlipidemia, unspecified: Secondary | ICD-10-CM | POA: Diagnosis not present

## 2021-12-10 DIAGNOSIS — E039 Hypothyroidism, unspecified: Secondary | ICD-10-CM | POA: Diagnosis not present

## 2022-01-15 ENCOUNTER — Encounter: Payer: Self-pay | Admitting: Adult Health

## 2022-01-15 ENCOUNTER — Ambulatory Visit (INDEPENDENT_AMBULATORY_CARE_PROVIDER_SITE_OTHER): Payer: Medicare Other | Admitting: Adult Health

## 2022-01-15 VITALS — BP 126/73 | HR 71 | Ht 62.5 in | Wt 121.5 lb

## 2022-01-15 DIAGNOSIS — N993 Prolapse of vaginal vault after hysterectomy: Secondary | ICD-10-CM | POA: Diagnosis not present

## 2022-01-15 DIAGNOSIS — N8110 Cystocele, unspecified: Secondary | ICD-10-CM | POA: Diagnosis not present

## 2022-01-15 DIAGNOSIS — Z4689 Encounter for fitting and adjustment of other specified devices: Secondary | ICD-10-CM

## 2022-01-15 NOTE — Progress Notes (Signed)
  Subjective:     Patient ID: Tiffany Kim, female   DOB: 1941-02-22, 81 y.o.   MRN: [id number]  HPI Tiffany Kim is a 81 year old white female, widowed, sp hysterectomy in for pessary maintenance. She is happy with the pessary. She is playing tennis again. PCP is Dr Nevada Crane.  Review of Systems Slight discharge in panties  Denies any bleeding Reviewed past medical,surgical, social and family history. Reviewed medications and allergies.     Objective:   Physical Exam BP 126/73 (BP Location: Left Arm, Patient Position: Sitting, Cuff Size: Normal)   Pulse 71   Ht 5' 2.5" (1.588 m)   Wt 121 lb 8 oz (55.1 kg)   BMI 21.87 kg/m     Skin warm and dry.Pelvic: external genitalia is normal in appearance no lesions, vagina: pessary removed, vaginal tissues pale, no lesions, +cystocele and vault prolapse,urethra has no lesions or masses noted, cervix and uterus are absent, adnexa: no masses or tenderness noted. Bladder is non tender and no masses felt. Pessary cleaned with soap and water and easily reinserted.  Examination chaperoned by Levy Pupa  LPN  [facility] - 35/36/14 0935       Pregnancy Intention Screening   Does the patient want to become pregnant in the next year? N/A    Does the patient's partner want to become pregnant in the next year? N/A    Would the patient like to discuss contraceptive options today? N/A      Contraception Wrap Up   Current Method Female Sterilization   hyst   End Method Female Sterilization   hyst   Contraception Counseling Provided No             Assessment:     1. Pessary maintenance, Gelhorn 2 1/2 inch placed 01/21/21 She is happy with pessary   2. Vaginal vault prolapse after hysterectomy, total  3. POP-Q stage 4 cystocele    Plan:     Follow up in 4 months for pessary maintenance

## 2022-01-19 ENCOUNTER — Ambulatory Visit: Payer: Medicare Other | Admitting: Adult Health

## 2022-01-29 ENCOUNTER — Ambulatory Visit
Admission: RE | Admit: 2022-01-29 | Discharge: 2022-01-29 | Disposition: A | Discharge status: Home / Self Care | Payer: Medicare Other | Source: Ambulatory Visit | Attending: Family Medicine | Admitting: Family Medicine

## 2022-01-29 VITALS — BP 135/83 | HR 67 | Temp 97.9°F | Resp 16

## 2022-01-29 DIAGNOSIS — R350 Frequency of micturition: Secondary | ICD-10-CM | POA: Insufficient documentation

## 2022-01-29 DIAGNOSIS — N3090 Cystitis, unspecified without hematuria: Secondary | ICD-10-CM | POA: Diagnosis not present

## 2022-01-29 LAB — POCT URINALYSIS DIP (MANUAL ENTRY)
Bilirubin, UA: NEGATIVE
Glucose, UA: NEGATIVE mg/dL
Ketones, POC UA: NEGATIVE mg/dL
Nitrite, UA: NEGATIVE
Protein Ur, POC: NEGATIVE mg/dL
Spec Grav, UA: 1.025 (ref 1.010–1.025)
Urobilinogen, UA: 0.2 E.U./dL
pH, UA: 5.5 (ref 5.0–8.0)

## 2022-01-29 MED ORDER — CEPHALEXIN 500 MG PO CAPS: 500.0000 mg | ORAL_CAPSULE | Freq: Two times a day (BID) | ORAL | 0 refills | Status: DC

## 2022-01-29 NOTE — ED Triage Notes (Signed)
Pt presents with urinary frequency that comes and goes xs 5-6 weeks. States last night was up every hour.   Also c/o left ear pain.

## 2022-01-29 NOTE — Discharge Instructions (Addendum)
You have had labs (urine culture) sent today. We will call you with any significant abnormalities or if there is need to begin or change treatment or pursue further follow up.  You may also review your test results online through MyChart. If you do not have a MyChart account, instructions to sign up should be on your discharge paperwork.  

## 2022-01-29 NOTE — ED Provider Notes (Signed)
[place]    ASSESSMENT & PLAN:  1. Urinary frequency   2. Cystitis    Begin: Meds ordered this encounter  Medications   cephALEXin (KEFLEX) 500 MG capsule    Sig: Take 1 capsule (500 mg total) by mouth 2 (two) times daily.    Dispense:  10 capsule    Refill:  0   No signs of pyelonephritis. Urine culture sent. Will follow up with her PCP or here if not showing improvement over the next 48 hours, sooner if needed.  Outlined signs and symptoms indicating need for more acute intervention. Patient verbalized understanding. After Visit Summary given.  SUBJECTIVE:  Tiffany Kim is a 81 y.o. female who complains of urinary frequency, urgency for the past 24 hours. Without associated flank pain, fever, chills, vaginal discharge or bleeding. Gross hematuria: not present. No specific aggravating or alleviating factors reported. No LE edema. Normal PO intake without n/v/d. Without specific abdominal pain. Ambulatory without difficulty. OTC treatment: none. H/O UTI: about 1x/year.  LMP: No LMP recorded. Patient has had a hysterectomy.  OBJECTIVE:  Vitals:   01/29/22 1101  BP: 135/83  Pulse: 67  Resp: 16  Temp: 97.9 F (36.6 C)  TempSrc: Oral  SpO2: 96%   General appearance: alert; no distress HENT: oropharynx: moist Lungs: unlabored respirations Abdomen: benign Back: no CVA tenderness Extremities: no edema; symmetrical with no gross deformities Skin: warm and dry Neurologic: normal gait Psychological: alert and cooperative; normal mood and affect  Labs Reviewed  POCT URINALYSIS DIP (MANUAL ENTRY) - Abnormal; Notable for the following components:      Result Value   Blood, UA trace-intact (*)    Leukocytes, UA Large (3+) (*)    All other components within normal limits  URINE CULTURE    Allergies  Allergen Reactions   Codeine Nausea And Vomiting   Other Nausea And Vomiting    Any pain med; anesthesia    Past Medical History:  Diagnosis Date    Current use of estrogen therapy 08/22/2014   Dizziness 08/22/2014   Endometriosis    Hypertension 08/22/2014   Hypothyroidism    IBS (irritable bowel syndrome)    Macular degeneration    Vitamin D deficiency    Social History   Socioeconomic History   Marital status: Widowed    Spouse name: Not on file   Number of children: 2   Years of education: Not on file   Highest education level: Not on file  Occupational History   Not on file  Tobacco Use   Smoking status: Former    Types: Cigarettes   Smokeless tobacco: Never  Vaping Use   Vaping Use: Never used  Substance and Sexual Activity   Alcohol use: Yes    Alcohol/week: 2.0 standard drinks of alcohol    Types: 2 Glasses of wine per week    Comment: wine every night   Drug use: No   Sexual activity: Not Currently    Birth control/protection: Surgical    Comment: hyst  Other Topics Concern   Not on file  Social History Narrative   Not on file   Social Determinants of Health   Financial Resource Strain: Low Risk  (02/09/2020)   Overall Financial Resource Strain (CARDIA)    Difficulty of Paying Living Expenses: Not hard at all  Food Insecurity: No Food Insecurity (02/09/2020)   Hunger Vital Sign    Worried About Running Out of Food in the Last Year: Never true  Ran Out of Food in the Last Year: Never true  Transportation Needs: No Transportation Needs (02/09/2020)   PRAPARE - Hydrologist (Medical): No    Lack of Transportation (Non-Medical): No  Physical Activity: Inactive (02/09/2020)   Exercise Vital Sign    Days of Exercise per Week: 0 days    Minutes of Exercise per Session: 0 min  Stress: No Stress Concern Present (02/09/2020)   [place]    Feeling of Stress : Only a little  Social Connections: Moderately Integrated (02/09/2020)   Social Connection and Isolation Panel [NHANES]    Frequency of Communication with  Friends and Family: More than three times a week    Frequency of Social Gatherings with Friends and Family: Once a week    Attends Religious Services: More than 4 times per year    Active Member of [employer] or Organizations: Not on file    Attends Archivist Meetings: Never    Marital Status: Married  Human resources officer Violence: Not At Risk (02/09/2020)   Humiliation, Afraid, Rape, and Kick questionnaire    Fear of Current or Ex-Partner: No    Emotionally Abused: No    Physically Abused: No    Sexually Abused: No   Family History  Problem Relation Age of Onset   Stroke Paternal Grandmother    Cancer Maternal Grandfather        pancreatic   Heart attack Father        Age 64   Heart attack Mother        Late 29s   Cancer Mother        colon   Hypertension Brother    Macular degeneration Brother    COPD Sister    Breast cancer Sister    Macular degeneration Sister         Vanessa Kick, MD 01/29/22 1135

## 2022-01-31 LAB — URINE CULTURE: Culture: 20000 — AB

## 2022-02-05 ENCOUNTER — Other Ambulatory Visit (HOSPITAL_COMMUNITY): Payer: Self-pay | Admitting: Adult Health

## 2022-02-05 DIAGNOSIS — Z1231 Encounter for screening mammogram for malignant neoplasm of breast: Secondary | ICD-10-CM

## 2022-02-19 ENCOUNTER — Ambulatory Visit (HOSPITAL_COMMUNITY)
Admission: RE | Admit: 2022-02-19 | Discharge: 2022-02-19 | Disposition: A | Discharge status: Home / Self Care | Payer: Medicare Other | Source: Ambulatory Visit | Attending: Adult Health | Admitting: Adult Health

## 2022-02-19 DIAGNOSIS — Z1231 Encounter for screening mammogram for malignant neoplasm of breast: Secondary | ICD-10-CM | POA: Diagnosis not present

## 2022-04-06 ENCOUNTER — Encounter: Payer: Self-pay | Admitting: Adult Health

## 2022-04-06 ENCOUNTER — Ambulatory Visit (INDEPENDENT_AMBULATORY_CARE_PROVIDER_SITE_OTHER): Payer: Medicare Other | Admitting: Adult Health

## 2022-04-06 VITALS — BP 128/81 | HR 63 | Ht 62.0 in | Wt 126.0 lb

## 2022-04-06 DIAGNOSIS — Z4689 Encounter for fitting and adjustment of other specified devices: Secondary | ICD-10-CM | POA: Diagnosis not present

## 2022-04-06 DIAGNOSIS — N993 Prolapse of vaginal vault after hysterectomy: Secondary | ICD-10-CM | POA: Diagnosis not present

## 2022-04-06 DIAGNOSIS — N898 Other specified noninflammatory disorders of vagina: Secondary | ICD-10-CM | POA: Diagnosis not present

## 2022-04-06 DIAGNOSIS — T8389XA Other specified complication of genitourinary prosthetic devices, implants and grafts, initial encounter: Secondary | ICD-10-CM | POA: Diagnosis not present

## 2022-04-06 NOTE — Progress Notes (Signed)
  Subjective:     Patient ID: Tiffany Kim, female   DOB: February 13, 1941, 81 y.o.   MRN: [id number]  HPI Tiffany Kim is a 81 year old white female, widowed, sp hysterectomy in complaining of having seen vaginal blood last Thursday, none since, has had LLQ discomfort on and off.  She stays active, playing tennis tomorrow.  She had Gelhorn Pessary.  PCP is Dr Nevada Crane.  Review of Systems +vaginal bleeding LLQ discomfort on and off Reviewed past medical,surgical, social and family history. Reviewed medications and allergies.     Objective:   Physical Exam BP 128/81 (BP Location: Left Arm, Patient Position: Sitting, Cuff Size: Normal)   Pulse 63   Ht '5\' 2"'$  (1.575 m)   Wt 126 lb (57.2 kg)   BMI 23.05 kg/m     Skin warm and dry.Pelvic: external genitalia is normal in appearance no lesions, vagina: pessary removed, vaginal tissues pale, +irritation left vaginal side wall, no odor, +cystocele and vault prolapse,urethra has no lesions or masses noted, cervix and uterus are absent, adnexa: no masses or tenderness noted. Bladder is non tender and no masses felt. Pessary cleaned with soap and water and easily reinserted.  Fall risk is low    04/06/2022   11:58 AM 02/09/2020   10:40 AM  Depression screen PHQ 2/9  Decreased Interest 0 0  Down, Depressed, Hopeless 0 0  PHQ - 2 Score 0 0  Altered sleeping  0  Tired, decreased energy  1  Change in appetite  0  Feeling bad or failure about yourself   0  Trouble concentrating  0  Moving slowly or fidgety/restless  0  Suicidal thoughts  0  PHQ-9 Score  1  Difficult doing work/chores  Not difficult at all   Examination chaperoned by Tish RN Assessment:     1. Vaginal irritation from pessary (HCC) IF bleeding persists can add metrogel  2. Pessary maintenance, Gelhorn 2 1/2 inch placed 01/21/21  3. Vaginal vault prolapse after hysterectomy, total     Plan:    TRy to keep note of when has LLQ discomfort  Follow up as scheduled 05/18/22 for pessary  maintenance

## 2022-04-19 DIAGNOSIS — Z23 Encounter for immunization: Secondary | ICD-10-CM | POA: Diagnosis not present

## 2022-05-13 DIAGNOSIS — H179 Unspecified corneal scar and opacity: Secondary | ICD-10-CM | POA: Diagnosis not present

## 2022-05-13 DIAGNOSIS — H01001 Unspecified blepharitis right upper eyelid: Secondary | ICD-10-CM | POA: Diagnosis not present

## 2022-05-13 DIAGNOSIS — H01005 Unspecified blepharitis left lower eyelid: Secondary | ICD-10-CM | POA: Diagnosis not present

## 2022-05-13 DIAGNOSIS — H04121 Dry eye syndrome of right lacrimal gland: Secondary | ICD-10-CM | POA: Diagnosis not present

## 2022-05-13 DIAGNOSIS — H11153 Pinguecula, bilateral: Secondary | ICD-10-CM | POA: Diagnosis not present

## 2022-05-13 DIAGNOSIS — H01002 Unspecified blepharitis right lower eyelid: Secondary | ICD-10-CM | POA: Diagnosis not present

## 2022-05-13 DIAGNOSIS — H01004 Unspecified blepharitis left upper eyelid: Secondary | ICD-10-CM | POA: Diagnosis not present

## 2022-05-18 ENCOUNTER — Encounter: Payer: Self-pay | Admitting: Adult Health

## 2022-05-18 ENCOUNTER — Ambulatory Visit (INDEPENDENT_AMBULATORY_CARE_PROVIDER_SITE_OTHER): Payer: Medicare Other | Admitting: Adult Health

## 2022-05-18 VITALS — BP 146/78 | HR 61 | Ht 62.0 in | Wt 126.2 lb

## 2022-05-18 DIAGNOSIS — N993 Prolapse of vaginal vault after hysterectomy: Secondary | ICD-10-CM | POA: Diagnosis not present

## 2022-05-18 DIAGNOSIS — N8110 Cystocele, unspecified: Secondary | ICD-10-CM

## 2022-05-18 DIAGNOSIS — Z4689 Encounter for fitting and adjustment of other specified devices: Secondary | ICD-10-CM | POA: Diagnosis not present

## 2022-05-18 NOTE — Progress Notes (Signed)
  Subjective:     Patient ID: Tiffany Kim, female   DOB: 1941-04-21, 81 y.o.   MRN: [id number]  HPI Tiffany Kim is a 81 year old white female,widowed, sp hysterectomy in for pessary maintenance.  PCP is Dr Nevada Crane.  Review of Systems She denies any bleeding,swa pink once Has some discharge, no odor Reviewed past medical,surgical, social and family history. Reviewed medications and allergies.     Objective:   Physical Exam BP (!) 146/78 (BP Location: Left Arm, Cuff Size: Normal)   Pulse 61   Ht '5\' 2"'$  (1.575 m)   Wt 126 lb 3.2 oz (57.2 kg)   BMI 23.08 kg/m     Skin warm and dry.Pelvic: external genitalia is normal in appearance no lesions, vagina: pessary removed, vaginal tissues pale, no lesions, +cystocele and vault prolapse,urethra has no lesions or masses noted, cervix and uterus are absent, adnexa: no masses or tenderness noted. Bladder is non tender and no masses felt. Pessary cleaned with soap and water and Dr Elonda Husky in for reinsertion, and to make sure size was good.   Examination chaperoned by Celene Squibb LPN  Assessment:     1. Pessary maintenance, Gelhorn 2 1/2 inch placed 01/21/21  2. Vaginal vault prolapse after hysterectomy, total  3. POP-Q stage 4 cystocele    Plan:    Return in 4 months for pessary maintenance

## 2022-05-20 DIAGNOSIS — L728 Other follicular cysts of the skin and subcutaneous tissue: Secondary | ICD-10-CM | POA: Diagnosis not present

## 2022-05-20 DIAGNOSIS — L708 Other acne: Secondary | ICD-10-CM | POA: Diagnosis not present

## 2022-05-20 DIAGNOSIS — L570 Actinic keratosis: Secondary | ICD-10-CM | POA: Diagnosis not present

## 2022-05-20 DIAGNOSIS — X32XXXA Exposure to sunlight, initial encounter: Secondary | ICD-10-CM | POA: Diagnosis not present

## 2022-05-20 DIAGNOSIS — L820 Inflamed seborrheic keratosis: Secondary | ICD-10-CM | POA: Diagnosis not present

## 2022-05-29 DIAGNOSIS — E785 Hyperlipidemia, unspecified: Secondary | ICD-10-CM | POA: Diagnosis not present

## 2022-05-29 DIAGNOSIS — E0590 Thyrotoxicosis, unspecified without thyrotoxic crisis or storm: Secondary | ICD-10-CM | POA: Diagnosis not present

## 2022-05-29 DIAGNOSIS — I10 Essential (primary) hypertension: Secondary | ICD-10-CM | POA: Diagnosis not present

## 2022-06-01 DIAGNOSIS — H353132 Nonexudative age-related macular degeneration, bilateral, intermediate dry stage: Secondary | ICD-10-CM | POA: Diagnosis not present

## 2022-06-01 DIAGNOSIS — H01001 Unspecified blepharitis right upper eyelid: Secondary | ICD-10-CM | POA: Diagnosis not present

## 2022-06-01 DIAGNOSIS — H43813 Vitreous degeneration, bilateral: Secondary | ICD-10-CM | POA: Diagnosis not present

## 2022-06-01 DIAGNOSIS — H01002 Unspecified blepharitis right lower eyelid: Secondary | ICD-10-CM | POA: Diagnosis not present

## 2022-06-04 DIAGNOSIS — D7589 Other specified diseases of blood and blood-forming organs: Secondary | ICD-10-CM | POA: Diagnosis not present

## 2022-06-04 DIAGNOSIS — R011 Cardiac murmur, unspecified: Secondary | ICD-10-CM | POA: Diagnosis not present

## 2022-06-04 DIAGNOSIS — K5909 Other constipation: Secondary | ICD-10-CM | POA: Diagnosis not present

## 2022-06-04 DIAGNOSIS — Z79899 Other long term (current) drug therapy: Secondary | ICD-10-CM | POA: Diagnosis not present

## 2022-06-04 DIAGNOSIS — E785 Hyperlipidemia, unspecified: Secondary | ICD-10-CM | POA: Diagnosis not present

## 2022-06-04 DIAGNOSIS — N951 Menopausal and female climacteric states: Secondary | ICD-10-CM | POA: Diagnosis not present

## 2022-06-04 DIAGNOSIS — R2689 Other abnormalities of gait and mobility: Secondary | ICD-10-CM | POA: Diagnosis not present

## 2022-06-04 DIAGNOSIS — Z0001 Encounter for general adult medical examination with abnormal findings: Secondary | ICD-10-CM | POA: Diagnosis not present

## 2022-06-04 DIAGNOSIS — I10 Essential (primary) hypertension: Secondary | ICD-10-CM | POA: Diagnosis not present

## 2022-06-04 DIAGNOSIS — E039 Hypothyroidism, unspecified: Secondary | ICD-10-CM | POA: Diagnosis not present

## 2022-06-04 DIAGNOSIS — Z87891 Personal history of nicotine dependence: Secondary | ICD-10-CM | POA: Diagnosis not present

## 2022-06-04 DIAGNOSIS — Z0000 Encounter for general adult medical examination without abnormal findings: Secondary | ICD-10-CM | POA: Diagnosis not present

## 2022-06-22 ENCOUNTER — Other Ambulatory Visit: Payer: Self-pay | Admitting: Adult Health

## 2022-07-01 DIAGNOSIS — L650 Telogen effluvium: Secondary | ICD-10-CM | POA: Diagnosis not present

## 2022-07-01 DIAGNOSIS — X32XXXD Exposure to sunlight, subsequent encounter: Secondary | ICD-10-CM | POA: Diagnosis not present

## 2022-07-01 DIAGNOSIS — L570 Actinic keratosis: Secondary | ICD-10-CM | POA: Diagnosis not present

## 2022-07-03 DIAGNOSIS — Z79899 Other long term (current) drug therapy: Secondary | ICD-10-CM | POA: Diagnosis not present

## 2022-07-03 DIAGNOSIS — L650 Telogen effluvium: Secondary | ICD-10-CM | POA: Diagnosis not present

## 2022-08-04 DIAGNOSIS — R0602 Shortness of breath: Secondary | ICD-10-CM | POA: Diagnosis not present

## 2022-08-04 DIAGNOSIS — R531 Weakness: Secondary | ICD-10-CM | POA: Diagnosis not present

## 2022-08-04 DIAGNOSIS — R051 Acute cough: Secondary | ICD-10-CM | POA: Diagnosis not present

## 2022-08-04 DIAGNOSIS — Z03818 Encounter for observation for suspected exposure to other biological agents ruled out: Secondary | ICD-10-CM | POA: Diagnosis not present

## 2022-08-31 DIAGNOSIS — H353132 Nonexudative age-related macular degeneration, bilateral, intermediate dry stage: Secondary | ICD-10-CM | POA: Diagnosis not present

## 2022-09-23 ENCOUNTER — Ambulatory Visit (INDEPENDENT_AMBULATORY_CARE_PROVIDER_SITE_OTHER): Payer: Medicare Other | Admitting: Adult Health

## 2022-09-23 ENCOUNTER — Encounter: Payer: Self-pay | Admitting: Adult Health

## 2022-09-23 VITALS — BP 128/77 | HR 64 | Ht 63.0 in | Wt 128.5 lb

## 2022-09-23 DIAGNOSIS — R350 Frequency of micturition: Secondary | ICD-10-CM

## 2022-09-23 DIAGNOSIS — Z4689 Encounter for fitting and adjustment of other specified devices: Secondary | ICD-10-CM | POA: Diagnosis not present

## 2022-09-23 DIAGNOSIS — N993 Prolapse of vaginal vault after hysterectomy: Secondary | ICD-10-CM | POA: Diagnosis not present

## 2022-09-23 DIAGNOSIS — N8110 Cystocele, unspecified: Secondary | ICD-10-CM

## 2022-09-23 LAB — POCT URINALYSIS DIPSTICK
Glucose, UA: NEGATIVE
Ketones, UA: NEGATIVE
Nitrite, UA: NEGATIVE
Protein, UA: POSITIVE — AB

## 2022-09-23 NOTE — Progress Notes (Signed)
  Subjective:     Patient ID: Tiffany Kim, female   DOB: July 22, 1940, 82 y.o.   MRN: [id number]  HPI Tiffany Kim is a 82 year old white female,widowed, sp hysterectomy in for pessary maintenance.   PCP is Dr Margo Aye.  Review of Systems +urinary frequency   No vaginal spotting Reviewed past medical,surgical, social and family history. Reviewed medications and allergies.  Objective:   Physical Exam BP 128/77 (BP Location: Left Arm, Patient Position: Sitting, Cuff Size: Normal)   Pulse 64   Ht 5\' 3"  (1.6 m)   Wt 128 lb 8 oz (58.3 kg)   BMI 22.76 kg/m  urine dipstick trace blood and protein and small leuks   Skin warm and dry.Pelvic: external genitalia is normal in appearance no lesions, vagina: pessary removed, vaginal tissues pale, no lesions, +cystocele and vault prolapse,urethra has no lesions or masses noted, cervix and uterus are absent, adnexa: no masses or tenderness noted. Bladder is non tender and no masses felt. Pessary cleaned with soap and water. Fall risk is low  [facility] - 09/23/22 1131       Pregnancy Intention Screening   Does the patient want to become pregnant in the next year? N/A    Does the patient's partner want to become pregnant in the next year? N/A    Would the patient like to discuss contraceptive options today? N/A      Contraception Wrap Up   Current Method Abstinence;Female Sterilization    End Method Abstinence;Female Sterilization    Contraception Counseling Provided No            Examination chaperoned by Marylu Lund young LPN Assessment:     1. Urinary frequency Will send urine for UA C&S to rule out UTI  - POCT Urinalysis Dipstick - Urine Culture - Urinalysis, Routine w reflex microscopic  2. Pessary maintenance, Gelhorn 2 1/2 inch placed 01/21/21   3. Vaginal vault prolapse after hysterectomy, total  4. POP-Q stage 4 cystocele      Plan:     Return in 4 months for pessary maintenance with me

## 2022-09-24 LAB — MICROSCOPIC EXAMINATION
Casts: NONE SEEN /lpf
Epithelial Cells (non renal): >10 /hpf — AB (ref 0–10)
WBC, UA: >30 /hpf — AB (ref 0–5)

## 2022-09-24 LAB — URINALYSIS, ROUTINE W REFLEX MICROSCOPIC
Bilirubin, UA: NEGATIVE
Glucose, UA: NEGATIVE
Nitrite, UA: POSITIVE — AB
Specific Gravity, UA: >=1.03 — AB (ref 1.005–1.030)
Urobilinogen, Ur: 0.2 mg/dL (ref 0.2–1.0)
pH, UA: 5.5 (ref 5.0–7.5)

## 2022-09-25 ENCOUNTER — Telehealth: Payer: Self-pay | Admitting: Adult Health

## 2022-09-25 MED ORDER — CEPHALEXIN 500 MG PO CAPS: 500.0000 mg | ORAL_CAPSULE | Freq: Three times a day (TID) | ORAL | 0 refills | Status: DC

## 2022-09-25 NOTE — Telephone Encounter (Signed)
Pt aware urine looks like UTI will rx keflex and culture not back

## 2022-09-28 LAB — URINE CULTURE

## 2022-11-30 DIAGNOSIS — H01001 Unspecified blepharitis right upper eyelid: Secondary | ICD-10-CM | POA: Diagnosis not present

## 2022-11-30 DIAGNOSIS — H01004 Unspecified blepharitis left upper eyelid: Secondary | ICD-10-CM | POA: Diagnosis not present

## 2022-11-30 DIAGNOSIS — H01002 Unspecified blepharitis right lower eyelid: Secondary | ICD-10-CM | POA: Diagnosis not present

## 2022-11-30 DIAGNOSIS — H353132 Nonexudative age-related macular degeneration, bilateral, intermediate dry stage: Secondary | ICD-10-CM | POA: Diagnosis not present

## 2022-12-24 DIAGNOSIS — I10 Essential (primary) hypertension: Secondary | ICD-10-CM | POA: Diagnosis not present

## 2022-12-24 DIAGNOSIS — E785 Hyperlipidemia, unspecified: Secondary | ICD-10-CM | POA: Diagnosis not present

## 2022-12-24 DIAGNOSIS — E0590 Thyrotoxicosis, unspecified without thyrotoxic crisis or storm: Secondary | ICD-10-CM | POA: Diagnosis not present

## 2022-12-30 DIAGNOSIS — K5909 Other constipation: Secondary | ICD-10-CM | POA: Diagnosis not present

## 2022-12-30 DIAGNOSIS — N951 Menopausal and female climacteric states: Secondary | ICD-10-CM | POA: Diagnosis not present

## 2022-12-30 DIAGNOSIS — D7589 Other specified diseases of blood and blood-forming organs: Secondary | ICD-10-CM | POA: Diagnosis not present

## 2022-12-30 DIAGNOSIS — I10 Essential (primary) hypertension: Secondary | ICD-10-CM | POA: Diagnosis not present

## 2022-12-30 DIAGNOSIS — N8110 Cystocele, unspecified: Secondary | ICD-10-CM | POA: Diagnosis not present

## 2022-12-30 DIAGNOSIS — Z79899 Other long term (current) drug therapy: Secondary | ICD-10-CM | POA: Diagnosis not present

## 2022-12-30 DIAGNOSIS — E785 Hyperlipidemia, unspecified: Secondary | ICD-10-CM | POA: Diagnosis not present

## 2022-12-30 DIAGNOSIS — Z79890 Hormone replacement therapy (postmenopausal): Secondary | ICD-10-CM | POA: Diagnosis not present

## 2022-12-30 DIAGNOSIS — R011 Cardiac murmur, unspecified: Secondary | ICD-10-CM | POA: Diagnosis not present

## 2022-12-30 DIAGNOSIS — E039 Hypothyroidism, unspecified: Secondary | ICD-10-CM | POA: Diagnosis not present

## 2022-12-30 DIAGNOSIS — Z87891 Personal history of nicotine dependence: Secondary | ICD-10-CM | POA: Diagnosis not present

## 2023-01-14 ENCOUNTER — Ambulatory Visit (INDEPENDENT_AMBULATORY_CARE_PROVIDER_SITE_OTHER): Payer: Medicare Other | Admitting: Adult Health

## 2023-01-14 ENCOUNTER — Encounter: Payer: Self-pay | Admitting: Adult Health

## 2023-01-14 VITALS — BP 149/78 | HR 65 | Ht 63.0 in | Wt 128.0 lb

## 2023-01-14 DIAGNOSIS — Z4689 Encounter for fitting and adjustment of other specified devices: Secondary | ICD-10-CM | POA: Diagnosis not present

## 2023-01-14 DIAGNOSIS — N993 Prolapse of vaginal vault after hysterectomy: Secondary | ICD-10-CM | POA: Diagnosis not present

## 2023-01-14 DIAGNOSIS — N8110 Cystocele, unspecified: Secondary | ICD-10-CM

## 2023-01-14 HISTORY — PX: BLADDER SURGERY: SHX569

## 2023-01-14 NOTE — Progress Notes (Signed)
  Subjective:     Patient ID: Tiffany Kim, female   DOB: 09-26-1940, 82 y.o.   MRN: [id number]  HPI My is a 82 year old white female, widowed, sp hysterectomy in for pessary maintenance. Has seen some pink discharge. She is seeing Dr Landry Corporal in [place] 02/02/23 and has surgery scheduled the next day to tack every thing up.  PCP is Dr Margo Aye.  Review of Systems For pessary maintenance +pink discharge Has urinary frequency, like not emptying bladder Reviewed past medical,surgical, social and family history. Reviewed medications and allergies.     Objective:   Physical Exam BP (!) 149/78 (BP Location: Left Arm, Patient Position: Sitting, Cuff Size: Normal)   Pulse 65   Ht 5\' 3"  (1.6 m)   Wt 128 lb (58.1 kg)   BMI 22.67 kg/m     Skin warm and dry.Pelvic: external genitalia is normal in appearance no lesions, vagina: pessary removed, vaginal tissues pale, no lesions,+pink discharge, +cystocele and vault prolapse,urethra has no lesions or masses noted, cervix and uterus are absent, adnexa: no masses or tenderness noted. Bladder is non tender and no masses felt. Pessary cleaned with soap and water. Fall risk is low  [facility] - 01/14/23 1400       Pregnancy Intention Screening   Does the patient want to become pregnant in the next year? No    Does the patient's partner want to become pregnant in the next year? No    Would the patient like to discuss contraceptive options today? No      Contraception Wrap Up   Current Method Female Sterilization   hysterectomy   End Method Female Sterilization   hysterectomy   Contraception Counseling Provided No            Examination chaperoned by Malachy Mood LPN  Assessment:     1. Pessary maintenance, Gelhorn 2 1/2 inch placed 01/21/21 Cleaned and replaced   2. Vaginal vault prolapse after hysterectomy, total   3. POP-Q stage 4 cystocele       Plan:     Return in about 5 months for ROS

## 2023-02-03 DIAGNOSIS — N393 Stress incontinence (female) (male): Secondary | ICD-10-CM | POA: Diagnosis not present

## 2023-02-03 DIAGNOSIS — N993 Prolapse of vaginal vault after hysterectomy: Secondary | ICD-10-CM | POA: Diagnosis not present

## 2023-02-03 DIAGNOSIS — N819 Female genital prolapse, unspecified: Secondary | ICD-10-CM | POA: Diagnosis not present

## 2023-03-16 DIAGNOSIS — Z9889 Other specified postprocedural states: Secondary | ICD-10-CM | POA: Diagnosis not present

## 2023-04-06 DIAGNOSIS — Z23 Encounter for immunization: Secondary | ICD-10-CM | POA: Diagnosis not present

## 2023-05-27 DIAGNOSIS — H01004 Unspecified blepharitis left upper eyelid: Secondary | ICD-10-CM | POA: Diagnosis not present

## 2023-05-27 DIAGNOSIS — H01005 Unspecified blepharitis left lower eyelid: Secondary | ICD-10-CM | POA: Diagnosis not present

## 2023-05-27 DIAGNOSIS — H16223 Keratoconjunctivitis sicca, not specified as Sjogren's, bilateral: Secondary | ICD-10-CM | POA: Diagnosis not present

## 2023-05-27 DIAGNOSIS — H353132 Nonexudative age-related macular degeneration, bilateral, intermediate dry stage: Secondary | ICD-10-CM | POA: Diagnosis not present

## 2023-05-27 DIAGNOSIS — H01001 Unspecified blepharitis right upper eyelid: Secondary | ICD-10-CM | POA: Diagnosis not present

## 2023-05-27 DIAGNOSIS — H01002 Unspecified blepharitis right lower eyelid: Secondary | ICD-10-CM | POA: Diagnosis not present

## 2023-05-27 DIAGNOSIS — H179 Unspecified corneal scar and opacity: Secondary | ICD-10-CM | POA: Diagnosis not present

## 2023-07-01 DIAGNOSIS — I10 Essential (primary) hypertension: Secondary | ICD-10-CM | POA: Diagnosis not present

## 2023-07-01 DIAGNOSIS — E785 Hyperlipidemia, unspecified: Secondary | ICD-10-CM | POA: Diagnosis not present

## 2023-07-01 DIAGNOSIS — E0590 Thyrotoxicosis, unspecified without thyrotoxic crisis or storm: Secondary | ICD-10-CM | POA: Diagnosis not present

## 2023-07-08 DIAGNOSIS — K219 Gastro-esophageal reflux disease without esophagitis: Secondary | ICD-10-CM | POA: Diagnosis not present

## 2023-07-08 DIAGNOSIS — G43109 Migraine with aura, not intractable, without status migrainosus: Secondary | ICD-10-CM | POA: Diagnosis not present

## 2023-07-08 DIAGNOSIS — Z79890 Hormone replacement therapy (postmenopausal): Secondary | ICD-10-CM | POA: Diagnosis not present

## 2023-07-08 DIAGNOSIS — R011 Cardiac murmur, unspecified: Secondary | ICD-10-CM | POA: Diagnosis not present

## 2023-07-08 DIAGNOSIS — N8110 Cystocele, unspecified: Secondary | ICD-10-CM | POA: Diagnosis not present

## 2023-07-08 DIAGNOSIS — E039 Hypothyroidism, unspecified: Secondary | ICD-10-CM | POA: Diagnosis not present

## 2023-07-08 DIAGNOSIS — E785 Hyperlipidemia, unspecified: Secondary | ICD-10-CM | POA: Diagnosis not present

## 2023-07-08 DIAGNOSIS — D7589 Other specified diseases of blood and blood-forming organs: Secondary | ICD-10-CM | POA: Diagnosis not present

## 2023-07-08 DIAGNOSIS — N951 Menopausal and female climacteric states: Secondary | ICD-10-CM | POA: Diagnosis not present

## 2023-07-08 DIAGNOSIS — K5909 Other constipation: Secondary | ICD-10-CM | POA: Diagnosis not present

## 2023-07-08 DIAGNOSIS — I10 Essential (primary) hypertension: Secondary | ICD-10-CM | POA: Diagnosis not present

## 2023-07-08 DIAGNOSIS — Z87891 Personal history of nicotine dependence: Secondary | ICD-10-CM | POA: Diagnosis not present

## 2023-07-28 DIAGNOSIS — Z1283 Encounter for screening for malignant neoplasm of skin: Secondary | ICD-10-CM | POA: Diagnosis not present

## 2023-07-28 DIAGNOSIS — L820 Inflamed seborrheic keratosis: Secondary | ICD-10-CM | POA: Diagnosis not present

## 2023-07-28 DIAGNOSIS — B078 Other viral warts: Secondary | ICD-10-CM | POA: Diagnosis not present

## 2023-07-28 DIAGNOSIS — D225 Melanocytic nevi of trunk: Secondary | ICD-10-CM | POA: Diagnosis not present

## 2023-08-24 DIAGNOSIS — H353132 Nonexudative age-related macular degeneration, bilateral, intermediate dry stage: Secondary | ICD-10-CM | POA: Diagnosis not present

## 2023-09-27 DIAGNOSIS — M79675 Pain in left toe(s): Secondary | ICD-10-CM | POA: Diagnosis not present

## 2023-09-27 DIAGNOSIS — L851 Acquired keratosis [keratoderma] palmaris et plantaris: Secondary | ICD-10-CM | POA: Diagnosis not present

## 2023-09-27 DIAGNOSIS — B351 Tinea unguium: Secondary | ICD-10-CM | POA: Diagnosis not present

## 2023-09-27 DIAGNOSIS — M2042 Other hammer toe(s) (acquired), left foot: Secondary | ICD-10-CM | POA: Diagnosis not present

## 2023-10-05 ENCOUNTER — Telehealth: Payer: Self-pay | Admitting: Orthopaedic Surgery

## 2023-10-05 NOTE — Telephone Encounter (Signed)
 Dr. Vicente Graham pt - Tiffany Kim from [facility] returned a call from [facility], stated the pt was previously seen in January and nothing in the notes mentions thigh pain.  If you need anything else, call them at [phone].

## 2023-10-05 NOTE — Telephone Encounter (Signed)
 Will let Dr, Iline Mallory know thanks

## 2023-10-06 ENCOUNTER — Ambulatory Visit (INDEPENDENT_AMBULATORY_CARE_PROVIDER_SITE_OTHER): Admitting: Orthopaedic Surgery

## 2023-10-06 ENCOUNTER — Encounter: Payer: Self-pay | Admitting: Orthopaedic Surgery

## 2023-10-06 DIAGNOSIS — M79652 Pain in left thigh: Secondary | ICD-10-CM

## 2023-10-06 NOTE — Progress Notes (Signed)
 Subjective:    Patient ID: Tiffany Kim, female    DOB: [birth year], 83 y.o.   MRN: [id number]  HPI She has developed occasional pain of the left thigh at times that is very painful when she has it but it only lasts a short time.  She has no trauma.  She has no untoward events.   She has no numbness, no pain past the thigh to the knee.  She is active.  She has not taken any medicine.   Review of Systems  Constitutional:  Positive for activity change.  Musculoskeletal:  Positive for arthralgias.  All other systems reviewed and are negative. For Review of Systems, all other systems reviewed and are negative.  The following is a summary of the past history medically, past history surgically, known current medicines, social history and family history.  This information is gathered electronically by the computer from prior information and documentation.  I review this each visit and have found including this information at this point in the chart is beneficial and informative.   Past Medical History:  Diagnosis Date   Current use of estrogen therapy 08/22/2014   Dizziness 08/22/2014   Endometriosis    Hypertension 08/22/2014   Hypothyroidism    IBS (irritable bowel syndrome)    Macular degeneration    Vitamin D deficiency     Past Surgical History:  Procedure Laterality Date   ABDOMINAL HYSTERECTOMY     BREAST BIOPSY     BROW LIFT Bilateral 03/29/2020   Procedure: BLEPHAROPLASTY;  Surgeon: Tarri Farm, MD;  Location: [facility];  Service: Ophthalmology;  Laterality: Bilateral;   BUNIONECTOMY  10/22/2011   Procedure: Tillman Folks;  Surgeon: Dewayne Ford, DPM;  Location: [facility];  Service: Orthopedics;  Laterality: Right;  [place] Bunionectomy Right Foot   CHOLECYSTECTOMY     FOOT SURGERY     HAMMER TOE SURGERY  10/22/2011   Procedure: HAMMER TOE CORRECTION;  Surgeon: Dewayne Ford, DPM;  Location: [facility];  Service: Orthopedics;  Laterality: Right;  Arthroplasty 5th Toe  Right Foot   LAPAROSCOPIC APPENDECTOMY N/A 12/22/2013   Procedure: APPENDECTOMY LAPAROSCOPIC;  Surgeon: Beau Bound, MD;  Location: [facility];  Service: General;  Laterality: N/A;   METATARSAL OSTEOTOMY  10/22/2011   Procedure: METATARSAL OSTEOTOMY;  Surgeon: Dewayne Ford, DPM;  Location: [facility];  Service: Orthopedics;  Laterality: Right;  Possible [place] Osteotomy Right Foot   THYROIDECTOMY      Current Outpatient Medications on File Prior to Visit  Medication Sig Dispense Refill   B Complex-C (SUPER B COMPLEX PO) Take 1 capsule by mouth daily.      Cholecalciferol (VITAMIN D PO) Take 2,000 Units by mouth daily.      estradiol  (CLIMARA  - DOSED IN MG/24 HR) 0.05 mg/24hr patch APPLY ONE PATCH WEEKLY. 12 patch 4   Fexofenadine HCl (ALLEGRA PO) Take by mouth.     levothyroxine (SYNTHROID) 88 MCG tablet Take 88 mcg by mouth daily.     losartan (COZAAR) 50 MG tablet Take 50 mg by mouth daily.      Multiple Vitamin (MULTIVITAMIN WITH MINERALS) TABS tablet Take 1 tablet by mouth daily.     Multiple Vitamins-Minerals (PRESERVISION AREDS PO) Take 1 capsule by mouth in the morning and at bedtime.      Polyethyl Glycol-Propyl Glycol (SYSTANE OP) Place 1 drop into both eyes 2 (two) times daily as needed (dry eyes).     sodium chloride  (OCEAN) 0.65 % SOLN nasal spray  Place 1 spray into both nostrils daily.     No current facility-administered medications on file prior to visit.    Social History   Socioeconomic History   Marital status: Widowed    Spouse name: Not on file   Number of children: 2   Years of education: Not on file   Highest education level: Not on file  Occupational History   Not on file  Tobacco Use   Smoking status: Former    Types: Cigarettes   Smokeless tobacco: Never  Vaping Use   Vaping status: Never Used  Substance and Sexual Activity   Alcohol use: Yes    Alcohol/week: 2.0 standard drinks of alcohol    Types: 2 Glasses of wine per week    Comment: wine  every night   Drug use: No   Sexual activity: Not Currently    Birth control/protection: Surgical    Comment: hyst  Other Topics Concern   Not on file  Social History Narrative   Not on file   Social Drivers of Health   Financial Resource Strain: Low Risk  (02/09/2020)   Overall Financial Resource Strain (CARDIA)    Difficulty of Paying Living Expenses: Not hard at all  Food Insecurity: No Food Insecurity (02/09/2020)   Hunger Vital Sign    Worried About Running Out of Food in the Last Year: Never true    Ran Out of Food in the Last Year: Never true  Transportation Needs: No Transportation Needs (02/09/2020)   PRAPARE - Administrator, Civil Service (Medical): No    Lack of Transportation (Non-Medical): No  Physical Activity: Inactive (02/09/2020)   Exercise Vital Sign    Days of Exercise per Week: 0 days    Minutes of Exercise per Session: 0 min  Stress: No Stress Concern Present (02/09/2020)   [employer] of Occupational Health - Occupational Stress Questionnaire    Feeling of Stress : Only a little  Social Connections: Moderately Integrated (02/09/2020)   Social Connection and Isolation Panel [NHANES]    Frequency of Communication with Friends and Family: More than three times a week    Frequency of Social Gatherings with Friends and Family: Once a week    Attends Religious Services: More than 4 times per year    Active Member of [employer] or Organizations: Not on file    Attends Banker Meetings: Never    Marital Status: Married  Catering manager Violence: Not At Risk (02/09/2020)   Humiliation, Afraid, Rape, and Kick questionnaire    Fear of Current or Ex-Partner: No    Emotionally Abused: No    Physically Abused: No    Sexually Abused: No    Family History  Problem Relation Age of Onset   Stroke Paternal Grandmother    Cancer Maternal Grandfather        pancreatic   Heart attack Father        Age 77   Heart attack Mother        Late 41s    Cancer Mother        colon   Hypertension Brother    Macular degeneration Brother    COPD Sister    Breast cancer Sister    Macular degeneration Sister     There were no vitals taken for this visit.  There is no height or weight on file to calculate BMI.      Objective:   Physical Exam Vitals and nursing note reviewed. Exam  conducted with a chaperone present.  Constitutional:      Appearance: She is well-developed.  HENT:     Head: Normocephalic and atraumatic.  Eyes:     Conjunctiva/sclera: Conjunctivae normal.     Pupils: Pupils are equal, round, and reactive to light.  Cardiovascular:     Rate and Rhythm: Normal rate and regular rhythm.  Pulmonary:     Effort: Pulmonary effort is normal.  Abdominal:     Palpations: Abdomen is soft.  Musculoskeletal:     Cervical back: Normal range of motion and neck supple.       Legs:  Skin:    General: Skin is warm and dry.  Neurological:     Mental Status: She is alert and oriented to person, place, and time.     Cranial Nerves: No cranial nerve deficit.     Motor: No abnormal muscle tone.     Coordination: Coordination normal.     Deep Tendon Reflexes: Reflexes are normal and symmetric. Reflexes normal.  Psychiatric:        Behavior: Behavior normal.        Thought Content: Thought content normal.        Judgment: Judgment normal.           Assessment & Plan:   Encounter Diagnosis  Name Primary?   Left thigh pain Yes   I will defer on X-rays today.  I have told her to take one Aleve twice a day daily after eating for the next two weeks.  Return in two weeks.  I will re-evaluate then.  Call if any problem.  Precautions discussed.  Electronically Signed Pleasant Brilliant, MD 4/23/20258:27 AM

## 2023-10-13 ENCOUNTER — Ambulatory Visit
Admission: RE | Admit: 2023-10-13 | Discharge: 2023-10-13 | Disposition: A | Discharge status: Home / Self Care | Source: Ambulatory Visit | Attending: Nurse Practitioner | Admitting: Nurse Practitioner

## 2023-10-13 ENCOUNTER — Ambulatory Visit (INDEPENDENT_AMBULATORY_CARE_PROVIDER_SITE_OTHER)

## 2023-10-13 VITALS — BP 148/80 | HR 76 | Temp 98.4°F | Resp 16

## 2023-10-13 DIAGNOSIS — J4520 Mild intermittent asthma, uncomplicated: Secondary | ICD-10-CM

## 2023-10-13 DIAGNOSIS — J4521 Mild intermittent asthma with (acute) exacerbation: Secondary | ICD-10-CM

## 2023-10-13 DIAGNOSIS — R059 Cough, unspecified: Secondary | ICD-10-CM | POA: Diagnosis not present

## 2023-10-13 MED ORDER — IPRATROPIUM-ALBUTEROL 0.5-2.5 (3) MG/3ML IN SOLN
3.0000 mL | Freq: Once | RESPIRATORY_TRACT | Status: AC
  Administered 2023-10-13: 3 mL via RESPIRATORY_TRACT

## 2023-10-13 MED ORDER — ALBUTEROL SULFATE HFA 108 (90 BASE) MCG/ACT IN AERS: 1.0000 | INHALATION_SPRAY | Freq: Four times a day (QID) | RESPIRATORY_TRACT | 0 refills | Status: DC | PRN

## 2023-10-13 MED ORDER — DEXAMETHASONE SODIUM PHOSPHATE 10 MG/ML IJ SOLN
10.0000 mg | Freq: Once | INTRAMUSCULAR | Status: AC
  Administered 2023-10-13: 10 mg via INTRAMUSCULAR

## 2023-10-13 MED ORDER — BENZONATATE 100 MG PO CAPS: 100.0000 mg | ORAL_CAPSULE | Freq: Three times a day (TID) | ORAL | 0 refills | Status: DC | PRN

## 2023-10-13 NOTE — ED Triage Notes (Signed)
 Pt reports cough, headache, nasal congestion and hoarseness, sore throat (feeling like it was closing up)  x 5 days

## 2023-10-13 NOTE — Discharge Instructions (Addendum)
 You have a viral upper respiratory infection that is causing inflammation in your chest.  Chest x-ray is negative today for pneumonia.  We gave you a breathing treatment today which helped open up your airway; start using the albuterol  inhaler I sent to the pharmacy scheduled every 6 hours for the next 2 days, then use every 4-6 hours as needed.  We have also given you a shot of steroid medicine today which will help with the inflammation in your chest..  Symptoms should improve over the next week to 10 days.  If you develop chest pain or shortness of breath, go to the emergency room.  We have tested you today for COVID-19.  You will see the results in Mychart and we will call you with positive results.  Please stay home and isolate until you are aware of the results.    Some things that can make you feel better are: - Increased rest - Increasing fluid with water/sugar free electrolytes - Acetaminophen  and ibuprofen as needed for fever/pain - Salt water gargling, chloraseptic spray and throat lozenges - OTC guaifenesin (Mucinex) 600 mg twice daily for congestion - Saline sinus flushes or a neti pot - Humidifying the air -Tessalon  Perles every 8 hours as needed for dry cough

## 2023-10-13 NOTE — ED Provider Notes (Signed)
 [facility]    CSN: [id number] Arrival date & time: 10/13/23  4098      History   Chief Complaint Chief Complaint  Patient presents with   Cough    Entered by patient    HPI Tiffany Kim is a 83 y.o. female.   Patient presents today with 5-day history of congested cough, shortness of breath, difficulty taking a deep breath, runny nose, sneezing, sore throat that is improved, headache, decreased appetite, and fatigue.  She denies fevers, body aches or chills, chest pain, stuffy nose, ear pain, abdominal pain, nausea/vomiting, and diarrhea.  She reports she was around her granddaughter last week who had a cough.  Has taken DayQuil and NyQuil for symptoms without much improvement.  Patient reports history of bronchitis when she is sick.  Has needed inhalers in the past to help with symptoms.  She is a former tobacco smoker.    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   Pessary maintenance, Gelhorn 2 1/2 inch placed 01/21/21 05/20/2021   Cystocele and rectocele with incomplete uterovaginal prolapse 12/26/2020   Rectocele 12/26/2020   Prolapse of vaginal wall with midline cystocele 12/26/2020   Hematuria 10/18/2020   Urinary frequency 10/18/2020   Mixed stress and urge urinary incontinence 08/12/2016   Primary osteoarthritis of first carpometacarpal joint of right hand 11/20/2015   Dizziness 08/22/2014   Hypertension 08/22/2014   Current use of estrogen therapy 08/22/2014   Elevated blood pressure 08/28/2013   Precordial pain 08/25/2013    HYPOTHYROIDISM 11/05/2008    Past Surgical History:  Procedure Laterality Date   ABDOMINAL HYSTERECTOMY     BREAST BIOPSY     BROW LIFT Bilateral 03/29/2020   Procedure: BLEPHAROPLASTY;  Surgeon: Tarri Farm, MD;  Location: [facility];  Service: Ophthalmology;  Laterality: Bilateral;   BUNIONECTOMY  10/22/2011   Procedure: Tillman Folks;  Surgeon: Dewayne Ford, DPM;  Location: [facility];  Service: Orthopedics;  Laterality: Right;  [place] Bunionectomy Right Foot   CHOLECYSTECTOMY     FOOT SURGERY     HAMMER TOE SURGERY  10/22/2011   Procedure: HAMMER TOE CORRECTION;  Surgeon: Dewayne Ford, DPM;  Location: [facility];  Service: Orthopedics;  Laterality: Right;  Arthroplasty 5th Toe Right Foot   LAPAROSCOPIC APPENDECTOMY N/A 12/22/2013   Procedure: APPENDECTOMY LAPAROSCOPIC;  Surgeon: Beau Bound, MD;  Location: [facility];  Service: General;  Laterality: N/A;   METATARSAL OSTEOTOMY  10/22/2011   Procedure: METATARSAL OSTEOTOMY;  Surgeon: Dewayne Ford, DPM;  Location: [facility];  Service: Orthopedics;  Laterality: Right;  Possible [place]iken Osteotomy Right Foot   THYROIDECTOMY      OB History     Gravida  2   Para  2   Term  2   Preterm      AB      Living  2      SAB      IAB      Ectopic      Multiple      Live Births  2  Home Medications    Prior to Admission medications   Medication Sig Start Date End Date Taking? Authorizing Provider  albuterol  (VENTOLIN  HFA) 108 (90 Base) MCG/ACT inhaler Inhale 1-2 puffs into the lungs every 6 (six) hours as needed for wheezing or shortness of breath. 10/13/23  Yes Wilhemena Harbour, NP  benzonatate  (TESSALON ) 100 MG capsule Take 1 capsule (100 mg total) by mouth 3 (three) times daily as needed for cough. Do not take with alcohol or while driving or operating heavy machinery.  May cause drowsiness. 10/13/23  Yes Wilhemena Harbour, NP  B Complex-C (SUPER B COMPLEX PO) Take 1 capsule by mouth daily.      [provider]  Cholecalciferol (VITAMIN D PO) Take 2,000 Units by mouth daily.     [provider]  estradiol  (CLIMARA  - DOSED IN MG/24 HR) 0.05 mg/24hr patch APPLY ONE PATCH WEEKLY. 06/22/22   Lendia Quay A, NP  Fexofenadine HCl (ALLEGRA PO) Take by mouth.    [provider]  levothyroxine (SYNTHROID) 88 MCG tablet Take 88 mcg by mouth daily. 01/28/21   [provider]  losartan (COZAAR) 50 MG tablet Take 50 mg by mouth daily.  07/16/16   [provider]  Multiple Vitamin (MULTIVITAMIN WITH MINERALS) TABS tablet Take 1 tablet by mouth daily.    [provider]  Multiple Vitamins-Minerals (PRESERVISION AREDS PO) Take 1 capsule by mouth in the morning and at bedtime.     [provider]  Polyethyl Glycol-Propyl Glycol (SYSTANE OP) Place 1 drop into both eyes 2 (two) times daily as needed (dry eyes).    [provider]  sodium chloride  (OCEAN) 0.65 % SOLN nasal spray Place 1 spray into both nostrils daily.    [provider]    Family History Family History  Problem Relation Age of Onset   Stroke Paternal Grandmother    Cancer Maternal Grandfather        pancreatic   Heart attack Father        Age 56   Heart attack Mother        Late 56s   Cancer Mother        colon   Hypertension Brother    Macular degeneration Brother    COPD Sister    Breast cancer Sister    Macular degeneration Sister     Social History Social History   Tobacco Use   Smoking status: Former    Types: Cigarettes   Smokeless tobacco: Never  Vaping Use   Vaping status: Never Used  Substance Use Topics   Alcohol use: Yes    Alcohol/week: 2.0 standard drinks of alcohol    Types: 2 Glasses of wine per week    Comment: wine every night   Drug use: No     Allergies   Codeine and Other   Review of Systems Review of Systems Per HPI  Physical Exam Triage Vital Signs ED Triage Vitals  Encounter Vitals Group     BP  10/13/23 1003 (!) 148/80     Systolic BP Percentile --      Diastolic BP Percentile --      Pulse Rate 10/13/23 1003 76     Resp 10/13/23 1003 16     Temp 10/13/23 1003 98.4 F (36.9 C)     Temp Source 10/13/23 1003 Oral     SpO2 10/13/23 1003 94 %     Weight --      Height --  Head Circumference --      Peak Flow --      Pain Score 10/13/23 1004 0     Pain Loc --      Pain Education --      Exclude from Growth Chart --    No data found.  Updated Vital Signs BP (!) 148/80 (BP Location: Right Arm)   Pulse 76   Temp 98.4 F (36.9 C) (Oral)   Resp 16   SpO2 94%   Visual Acuity Right Eye Distance:   Left Eye Distance:   Bilateral Distance:    Right Eye Near:   Left Eye Near:    Bilateral Near:     Physical Exam Vitals and nursing note reviewed.  Constitutional:      General: She is not in acute distress.    Appearance: Normal appearance. She is not ill-appearing or toxic-appearing.  HENT:     Head: Normocephalic and atraumatic.     Right Ear: Tympanic membrane, ear canal and external ear normal.     Left Ear: Tympanic membrane, ear canal and external ear normal.     Nose: No congestion or rhinorrhea.     Mouth/Throat:     Mouth: Mucous membranes are moist.     Pharynx: Oropharynx is clear. No oropharyngeal exudate or posterior oropharyngeal erythema.  Eyes:     General: No scleral icterus.    Extraocular Movements: Extraocular movements intact.  Cardiovascular:     Rate and Rhythm: Normal rate and regular rhythm.  Pulmonary:     Effort: Pulmonary effort is normal. No respiratory distress.     Breath sounds: Decreased air movement present. Wheezing present. No rhonchi or rales.  Musculoskeletal:     Cervical back: Normal range of motion and neck supple.  Lymphadenopathy:     Cervical: No cervical adenopathy.  Skin:    General: Skin is warm and dry.     Coloration: Skin is not jaundiced or pale.     Findings: No erythema or rash.  Neurological:      Mental Status: She is alert and oriented to person, place, and time.  Psychiatric:        Behavior: Behavior is cooperative.      UC Treatments / Results  Labs (all labs ordered are listed, but only abnormal results are displayed) Labs Reviewed - No data to display  EKG   Radiology DG Chest 2 View Result Date: 10/13/2023 CLINICAL DATA:  Cough for 5 days EXAM: CHEST - 2 VIEW COMPARISON:  08/09/2021. FINDINGS: The heart size and mediastinal contours are within normal limits. No consolidation, effusion, or pneumothorax. No edema. The visualized skeletal structures are unremarkable. IMPRESSION: No acute cardiopulmonary disease. Electronically Signed   By: Adrianna Horde M.D.   On: 10/13/2023 10:52    Procedures Procedures (including critical care time)  Medications Ordered in UC Medications  ipratropium-albuterol  (DUONEB) 0.5-2.5 (3) MG/3ML nebulizer solution 3 mL (3 mLs Nebulization Given 10/13/23 1047)  dexamethasone  (DECADRON ) injection 10 mg (10 mg Intramuscular Given 10/13/23 1047)    Initial Impression / Assessment and Plan / UC Course  I have reviewed the triage vital signs and the nursing notes.  Pertinent labs & imaging results that were available during my care of the patient were reviewed by me and considered in my medical decision making (see chart for details).   Patient is well-appearing, normotensive, afebrile, not tachycardic, not tachypneic, oxygenating well on room air.    1. Mild intermittent reactive airway disease with  acute exacerbation Vitals and exam are reassuring today Chest x-ray is negative for pneumonia DuoNeb and dexamethasone  10 mg IM was given with improvement in air movement bilaterally; SpO2 remained stable Start scheduled albuterol  inhaler treatments for the next 2 days, then use as needed Other supportive care discussed with patient including cough suppressant medication, guaifenesin ER and return precautions discussed  The patient was given the  opportunity to ask questions.  All questions answered to their satisfaction.  The patient is in agreement to this plan.   Final Clinical Impressions(s) / UC Diagnoses   Final diagnoses:  Mild intermittent reactive airway disease with acute exacerbation     Discharge Instructions      You have a viral upper respiratory infection that is causing inflammation in your chest.  Chest x-ray is negative today for pneumonia.  We gave you a breathing treatment today which helped open up your airway; start using the albuterol  inhaler I sent to the pharmacy scheduled every 6 hours for the next 2 days, then use every 4-6 hours as needed.  We have also given you a shot of steroid medicine today which will help with the inflammation in your chest..  Symptoms should improve over the next week to 10 days.  If you develop chest pain or shortness of breath, go to the emergency room.  We have tested you today for COVID-19.  You will see the results in Mychart and we will call you with positive results.  Please stay home and isolate until you are aware of the results.    Some things that can make you feel better are: - Increased rest - Increasing fluid with water/sugar free electrolytes - Acetaminophen  and ibuprofen as needed for fever/pain - Salt water gargling, chloraseptic spray and throat lozenges - OTC guaifenesin (Mucinex) 600 mg twice daily for congestion - Saline sinus flushes or a neti pot - Humidifying the air -Tessalon  Perles every 8 hours as needed for dry cough      ED Prescriptions     Medication Sig Dispense Auth. Provider   albuterol  (VENTOLIN  HFA) 108 (90 Base) MCG/ACT inhaler Inhale 1-2 puffs into the lungs every 6 (six) hours as needed for wheezing or shortness of breath. 8 g Thena Fireman A, NP   benzonatate  (TESSALON ) 100 MG capsule Take 1 capsule (100 mg total) by mouth 3 (three) times daily as needed for cough. Do not take with alcohol or while driving or operating heavy  machinery.  May cause drowsiness. 21 capsule Wilhemena Harbour, NP      PDMP not reviewed this encounter.   Wilhemena Harbour, NP 10/13/23 [phone]

## 2023-10-20 ENCOUNTER — Other Ambulatory Visit

## 2023-10-20 ENCOUNTER — Other Ambulatory Visit (INDEPENDENT_AMBULATORY_CARE_PROVIDER_SITE_OTHER)

## 2023-10-20 ENCOUNTER — Ambulatory Visit (INDEPENDENT_AMBULATORY_CARE_PROVIDER_SITE_OTHER): Admitting: Orthopaedic Surgery

## 2023-10-20 ENCOUNTER — Encounter: Payer: Self-pay | Admitting: Orthopaedic Surgery

## 2023-10-20 VITALS — BP 135/87 | HR 71 | Ht 63.0 in | Wt 128.0 lb

## 2023-10-20 DIAGNOSIS — M25552 Pain in left hip: Secondary | ICD-10-CM | POA: Diagnosis not present

## 2023-10-20 NOTE — Progress Notes (Signed)
 My thigh still hurts now and then  She has occasional episode pain that affects her anterior thigh for about a few seconds at a time.  There is no rhyme or reason when it will come.  It happened last week while standing in the kitchen.  She had to grab the counter to keep from falling.  She has taken the Aleve which had no effect.  She has no redness, no swelling.  She is very active and has no trauma.  Left thigh exam is normal.  ROM of the left hip is full as is the back. NV intact.  X-rays were done of the left hip, reported separately.  Negative.  Encounter Diagnosis  Name Primary?   Pain in left hip Yes   I do not know why she has this pain.    I have offered her another opinion with Dr. Lucienne Ryder at our [facility] office.  She is taking her family to [facility] in July.  She will just observe it for now and call if it gets worse.  Call if any problem.  Precautions discussed.  Electronically Signed Pleasant Brilliant, MD 5/7/202510:38 AM

## 2023-11-04 ENCOUNTER — Ambulatory Visit
Admission: RE | Admit: 2023-11-04 | Discharge: 2023-11-04 | Disposition: A | Discharge status: Home / Self Care | Source: Ambulatory Visit | Attending: Nurse Practitioner

## 2023-11-04 VITALS — BP 146/82 | HR 72 | Temp 97.8°F | Resp 20

## 2023-11-04 DIAGNOSIS — J019 Acute sinusitis, unspecified: Secondary | ICD-10-CM

## 2023-11-04 DIAGNOSIS — B9689 Other specified bacterial agents as the cause of diseases classified elsewhere: Secondary | ICD-10-CM

## 2023-11-04 MED ORDER — AMOXICILLIN-POT CLAVULANATE 875-125 MG PO TABS: 1.0000 | ORAL_TABLET | Freq: Two times a day (BID) | ORAL | 0 refills | Status: AC

## 2023-11-04 NOTE — ED Provider Notes (Signed)
 [facility]    CSN: [id number] Arrival date & time: 11/04/23  1016      History   Chief Complaint Chief Complaint  Patient presents with   Follow-up    Was there on April 30. I am still sick. - Entered by patient    HPI Tiffany Kim is a 83 y.o. female.   Patient presents today with approximately 1 month history of nasal congestion, sinus pressure, and frontal headache.  Also endorses bilateral ear pressure when she blows her nose.  No recent fever, body aches, chills, shortness of breath or chest pain, cough, chest congestion, abdominal pain, nausea/vomiting, diarrhea, change in appetite.  She has been taking DayQuil, NyQuil, and using rescue inhaler occasionally when she needs to which does seem to help with symptoms temporarily.  She was seen on 10/12/2020, treated for an asthma exacerbation reports breathing symptoms have improved.    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   Pessary maintenance, Gelhorn 2 1/2 inch placed 01/21/21 05/20/2021   Cystocele and rectocele with incomplete uterovaginal prolapse 12/26/2020   Rectocele 12/26/2020   Prolapse of vaginal wall with midline cystocele 12/26/2020   Hematuria 10/18/2020   Urinary frequency 10/18/2020   Mixed stress and urge urinary incontinence 08/12/2016   Primary osteoarthritis of first carpometacarpal joint of right hand 11/20/2015   Dizziness 08/22/2014   Hypertension 08/22/2014   Current use of estrogen therapy 08/22/2014   Elevated blood pressure 08/28/2013   Precordial pain 08/25/2013    HYPOTHYROIDISM 11/05/2008    Past Surgical History:  Procedure Laterality Date   ABDOMINAL HYSTERECTOMY     BREAST BIOPSY     BROW LIFT Bilateral 03/29/2020   Procedure: BLEPHAROPLASTY;  Surgeon: Tarri Farm, MD;  Location: [facility];  Service: Ophthalmology;  Laterality: Bilateral;   BUNIONECTOMY  10/22/2011   Procedure: Tillman Folks;  Surgeon: Dewayne Ford, DPM;  Location: [facility];  Service: Orthopedics;  Laterality: Right;  [place] Bunionectomy Right Foot   CHOLECYSTECTOMY     FOOT SURGERY     HAMMER TOE SURGERY  10/22/2011   Procedure: HAMMER TOE CORRECTION;  Surgeon: Dewayne Ford, DPM;  Location: [facility];  Service: Orthopedics;  Laterality: Right;  Arthroplasty 5th Toe Right Foot   LAPAROSCOPIC APPENDECTOMY N/A 12/22/2013   Procedure: APPENDECTOMY LAPAROSCOPIC;  Surgeon: Beau Bound, MD;  Location: [facility];  Service: General;  Laterality: N/A;   METATARSAL OSTEOTOMY  10/22/2011   Procedure: METATARSAL OSTEOTOMY;  Surgeon: Dewayne Ford, DPM;  Location: [facility];  Service: Orthopedics;  Laterality: Right;  Possible [place]iken Osteotomy Right Foot   THYROIDECTOMY      OB History     Gravida  2   Para  2   Term  2   Preterm      AB      Living  2      SAB      IAB      Ectopic      Multiple      Live Births  2  Home Medications    Prior to Admission medications   Medication Sig Start Date End Date Taking? Authorizing Provider  amoxicillin -clavulanate (AUGMENTIN ) 875-125 MG tablet Take 1 tablet by mouth 2 (two) times daily for 7 days. 11/04/23 11/11/23 Yes Wilhemena Harbour, NP  albuterol  (VENTOLIN  HFA) 108 (90 Base) MCG/ACT inhaler Inhale 1-2 puffs into the lungs every 6 (six) hours as needed for wheezing or shortness of breath. 10/13/23   Wilhemena Harbour, NP  B Complex-C (SUPER B COMPLEX PO) Take 1 capsule by mouth daily.     [provider]  Cholecalciferol (VITAMIN D PO) Take 2,000 Units by mouth daily.      [provider]  estradiol  (CLIMARA  - DOSED IN MG/24 HR) 0.05 mg/24hr patch APPLY ONE PATCH WEEKLY. 06/22/22   Lendia Quay A, NP  Fexofenadine HCl (ALLEGRA PO) Take by mouth.    [provider]  levothyroxine (SYNTHROID) 75 MCG tablet Take 75 mcg by mouth daily. 10/13/23   [provider]  losartan (COZAAR) 50 MG tablet Take 50 mg by mouth daily.  07/16/16   [provider]  Multiple Vitamin (MULTIVITAMIN WITH MINERALS) TABS tablet Take 1 tablet by mouth daily.    [provider]  Multiple Vitamins-Minerals (PRESERVISION AREDS PO) Take 1 capsule by mouth in the morning and at bedtime.     [provider]  Polyethyl Glycol-Propyl Glycol (SYSTANE OP) Place 1 drop into both eyes 2 (two) times daily as needed (dry eyes).    [provider]  sodium chloride  (OCEAN) 0.65 % SOLN nasal spray Place 1 spray into both nostrils daily.    [provider]    Family History Family History  Problem Relation Age of Onset   Stroke Paternal Grandmother    Cancer Maternal Grandfather        pancreatic   Heart attack Father        Age 66   Heart attack Mother        Late 1s   Cancer Mother        colon   Hypertension Brother    Macular degeneration Brother    COPD Sister    Breast cancer Sister    Macular degeneration Sister     Social History Social History   Tobacco Use   Smoking status: Former    Types: Cigarettes   Smokeless tobacco: Never  Vaping Use   Vaping status: Never Used  Substance Use Topics   Alcohol use: Yes    Alcohol/week: 2.0 standard drinks of alcohol    Types: 2 Glasses of wine per week    Comment: wine every night   Drug use: No     Allergies   Codeine and Other   Review of Systems Review of Systems Per HPI  Physical Exam Triage Vital Signs ED Triage Vitals  Encounter Vitals Group     BP 11/04/23 1021 (!) 146/82     Systolic BP Percentile --      Diastolic BP Percentile --       Pulse Rate 11/04/23 1021 72     Resp 11/04/23 1021 20     Temp 11/04/23 1021 97.8 F (36.6 C)     Temp Source 11/04/23 1021 Oral     SpO2 11/04/23 1021 96 %     Weight --      Height --      Head Circumference --      Peak Flow --      Pain  Score 11/04/23 1023 0     Pain Loc --      Pain Education --      Exclude from Growth Chart --    No data found.  Updated Vital Signs BP (!) 146/82 (BP Location: Right Arm)   Pulse 72   Temp 97.8 F (36.6 C) (Oral)   Resp 20   SpO2 96%   Visual Acuity Right Eye Distance:   Left Eye Distance:   Bilateral Distance:    Right Eye Near:   Left Eye Near:    Bilateral Near:     Physical Exam Vitals and nursing note reviewed.  Constitutional:      General: She is not in acute distress.    Appearance: Normal appearance. She is not ill-appearing or toxic-appearing.  HENT:     Head: Normocephalic and atraumatic.     Right Ear: Tympanic membrane, ear canal and external ear normal.     Left Ear: Tympanic membrane, ear canal and external ear normal.     Nose: Rhinorrhea present. No congestion.     Right Sinus: Frontal sinus tenderness present. No maxillary sinus tenderness.     Left Sinus: Frontal sinus tenderness present. No maxillary sinus tenderness.     Mouth/Throat:     Mouth: Mucous membranes are moist.     Pharynx: Oropharynx is clear. Posterior oropharyngeal erythema present. No oropharyngeal exudate.  Eyes:     General: No scleral icterus.    Extraocular Movements: Extraocular movements intact.  Cardiovascular:     Rate and Rhythm: Normal rate and regular rhythm.  Pulmonary:     Effort: Pulmonary effort is normal. No respiratory distress.     Breath sounds: Normal breath sounds. No wheezing, rhonchi or rales.  Abdominal:     General: Abdomen is flat.  Musculoskeletal:     Cervical back: Normal range of motion and neck supple.  Lymphadenopathy:     Cervical: No cervical adenopathy.  Skin:    General: Skin is warm and dry.      Capillary Refill: Capillary refill takes less than 2 seconds.     Coloration: Skin is not jaundiced or pale.     Findings: No erythema or rash.  Neurological:     Mental Status: She is alert and oriented to person, place, and time.  Psychiatric:        Behavior: Behavior is cooperative.      UC Treatments / Results  Labs (all labs ordered are listed, but only abnormal results are displayed) Labs Reviewed - No data to display  EKG   Radiology No results found.  Procedures Procedures (including critical care time)  Medications Ordered in UC Medications - No data to display  Initial Impression / Assessment and Plan / UC Course  I have reviewed the triage vital signs and the nursing notes.  Pertinent labs & imaging results that were available during my care of the patient were reviewed by me and considered in my medical decision making (see chart for details).   Patient is well-appearing, normotensive, afebrile, not tachycardic, not tachypneic, oxygenating well on room air.    1. Acute bacterial sinusitis Vitals and exam are reassuring today Treat for sinusitis with Augmentin  twice daily for 7 days Supportive care discussed with patient Return and ER precautions also discussed  The patient was given the opportunity to ask questions.  All questions answered to their satisfaction.  The patient is in agreement to this plan.   Final Clinical Impressions(s) / UC  Diagnoses   Final diagnoses:  Acute bacterial sinusitis     Discharge Instructions      You have a sinus infection.  Take the Augmentin  twice daily for 7 days to treat it.  Start Mucinex 600 mg twice daily and saline nasal sprays additionally.  Seek care if symptoms do not improve with treatment.    ED Prescriptions     Medication Sig Dispense Auth. Provider   amoxicillin -clavulanate (AUGMENTIN ) 875-125 MG tablet Take 1 tablet by mouth 2 (two) times daily for 7 days. 14 tablet Wilhemena Harbour, NP       PDMP not reviewed this encounter.   Wilhemena Harbour, NP 11/04/23 [phone]

## 2023-11-04 NOTE — ED Triage Notes (Signed)
 Pt report headache, congestion, fatigued was seen on 10/13/2023. Still is no feeling any better. Pt states she thinks "her sx's have moved"

## 2023-11-04 NOTE — Discharge Instructions (Signed)
 You have a sinus infection.  Take the Augmentin  twice daily for 7 days to treat it.  Start Mucinex 600 mg twice daily and saline nasal sprays additionally.  Seek care if symptoms do not improve with treatment.

## 2023-12-02 ENCOUNTER — Ambulatory Visit
Admission: RE | Admit: 2023-12-02 | Discharge: 2023-12-02 | Disposition: A | Discharge status: Home / Self Care | Source: Ambulatory Visit | Attending: Nurse Practitioner

## 2023-12-02 VITALS — BP 144/76 | HR 74 | Temp 97.5°F | Resp 18

## 2023-12-02 DIAGNOSIS — B9689 Other specified bacterial agents as the cause of diseases classified elsewhere: Secondary | ICD-10-CM

## 2023-12-02 DIAGNOSIS — J329 Chronic sinusitis, unspecified: Secondary | ICD-10-CM | POA: Diagnosis not present

## 2023-12-02 MED ORDER — DOXYCYCLINE HYCLATE 100 MG PO CAPS: 100.0000 mg | ORAL_CAPSULE | Freq: Two times a day (BID) | ORAL | 0 refills | Status: AC

## 2023-12-02 NOTE — ED Triage Notes (Signed)
 Pt reports sinus congestion headache, terrible taste in her mouth sinus drainage. Pt states when she blows her nose the mucus is yellow.

## 2023-12-02 NOTE — ED Provider Notes (Signed)
 [facility]    CSN: [id number] Arrival date & time: 12/02/23  1257      History   Chief Complaint Chief Complaint  Patient presents with   Sore Throat    May be sinus infection - Entered by patient    HPI Tiffany Kim is a 83 y.o. female.   Patient present today with ongoing sinus drainage, headache, sinus pressure, foul taste in her mouth, and bodyaches/chills.  Reports she is continuing to get yellow nasal congestion when she blows her nose.  No known fevers, shortness of breath, chest pain or tightness, significant cough, or change in appetite.  Patient was seen and treated for sinus infection with Augmentin  on 11/04/2023 and reports she took the entire course of antibiotics.  She is still using nasal spray and taking guaifenesin without improvement.    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   Pessary maintenance, Gelhorn 2 1/2 inch placed 01/21/21 05/20/2021   Cystocele and rectocele with incomplete uterovaginal prolapse 12/26/2020   Rectocele 12/26/2020   Prolapse of vaginal wall with midline cystocele 12/26/2020   Hematuria 10/18/2020   Urinary frequency 10/18/2020   Mixed stress and urge urinary incontinence 08/12/2016   Primary osteoarthritis of first carpometacarpal joint of right hand 11/20/2015   Dizziness 08/22/2014   Hypertension 08/22/2014   Current use of estrogen therapy 08/22/2014   Elevated blood pressure 08/28/2013   Precordial pain 08/25/2013   HYPOTHYROIDISM 11/05/2008    Past Surgical History:  Procedure Laterality Date    ABDOMINAL HYSTERECTOMY     BREAST BIOPSY     BROW LIFT Bilateral 03/29/2020   Procedure: BLEPHAROPLASTY;  Surgeon: Tarri Farm, MD;  Location: [facility];  Service: Ophthalmology;  Laterality: Bilateral;   BUNIONECTOMY  10/22/2011   Procedure: Tillman Folks;  Surgeon: Dewayne Ford, DPM;  Location: [facility];  Service: Orthopedics;  Laterality: Right;  [place] Bunionectomy Right Foot   CHOLECYSTECTOMY     FOOT SURGERY     HAMMER TOE SURGERY  10/22/2011   Procedure: HAMMER TOE CORRECTION;  Surgeon: Dewayne Ford, DPM;  Location: [facility];  Service: Orthopedics;  Laterality: Right;  Arthroplasty 5th Toe Right Foot   LAPAROSCOPIC APPENDECTOMY N/A 12/22/2013   Procedure: APPENDECTOMY LAPAROSCOPIC;  Surgeon: Beau Bound, MD;  Location: [facility];  Service: General;  Laterality: N/A;   METATARSAL OSTEOTOMY  10/22/2011   Procedure: METATARSAL OSTEOTOMY;  Surgeon: Dewayne Ford, DPM;  Location: [facility];  Service: Orthopedics;  Laterality: Right;  Possible Aiken Osteotomy Right Foot   THYROIDECTOMY      OB History     Gravida  2   Para  2   Term  2   Preterm      AB      Living  2      SAB      IAB      Ectopic      Multiple      Live Births  2            Home Medications  Prior to Admission medications   Medication Sig Start Date End Date Taking? Authorizing Provider  doxycycline (VIBRAMYCIN) 100 MG capsule Take 1 capsule (100 mg total) by mouth 2 (two) times daily for 7 days. 12/02/23 12/09/23 Yes Wilhemena Harbour, NP  albuterol  (VENTOLIN  HFA) 108 (90 Base) MCG/ACT inhaler Inhale 1-2 puffs into the lungs every 6 (six) hours as needed for wheezing or shortness of breath. 10/13/23   Wilhemena Harbour, NP  B Complex-C (SUPER B COMPLEX PO) Take 1 capsule by mouth daily.     [provider]  Cholecalciferol (VITAMIN D PO) Take 2,000 Units by mouth daily.     [provider]  estradiol  (CLIMARA  - DOSED IN MG/24 HR) 0.05 mg/24hr patch  APPLY ONE PATCH WEEKLY. 06/22/22   Lendia Quay A, NP  Fexofenadine HCl (ALLEGRA PO) Take by mouth.    [provider]  levothyroxine (SYNTHROID) 75 MCG tablet Take 75 mcg by mouth daily. 10/13/23   [provider]  losartan (COZAAR) 50 MG tablet Take 50 mg by mouth daily.  07/16/16   [provider]  Multiple Vitamin (MULTIVITAMIN WITH MINERALS) TABS tablet Take 1 tablet by mouth daily.    [provider]  Multiple Vitamins-Minerals (PRESERVISION AREDS PO) Take 1 capsule by mouth in the morning and at bedtime.     [provider]  Polyethyl Glycol-Propyl Glycol (SYSTANE OP) Place 1 drop into both eyes 2 (two) times daily as needed (dry eyes).    [provider]  sodium chloride  (OCEAN) 0.65 % SOLN nasal spray Place 1 spray into both nostrils daily.    [provider]    Family History Family History  Problem Relation Age of Onset   Stroke Paternal Grandmother    Cancer Maternal Grandfather        pancreatic   Heart attack Father        Age 70   Heart attack Mother        Late 91s   Cancer Mother        colon   Hypertension Brother    Macular degeneration Brother    COPD Sister    Breast cancer Sister    Macular degeneration Sister     Social History Social History   Tobacco Use   Smoking status: Former    Types: Cigarettes   Smokeless tobacco: Never  Vaping Use   Vaping status: Never Used  Substance Use Topics   Alcohol use: Yes    Alcohol/week: 2.0 standard drinks of alcohol    Types: 2 Glasses of wine per week    Comment: wine every night   Drug use: No     Allergies   Codeine and Other   Review of Systems Review of Systems Per HPI  Physical Exam Triage Vital Signs ED Triage Vitals  Encounter Vitals Group     BP 12/02/23 1304 (!) 144/76     Girls Systolic BP Percentile --      Girls Diastolic BP Percentile --      Boys Systolic BP Percentile --      Boys Diastolic BP Percentile --       Pulse Rate 12/02/23 1304 74     Resp 12/02/23 1304 18     Temp 12/02/23 1304 (!) 97.5 F (36.4 C)     Temp Source 12/02/23 1304 Oral     SpO2 12/02/23 1304 96 %     Weight --      Height --  Head Circumference --      Peak Flow --      Pain Score 12/02/23 1307 0     Pain Loc --      Pain Education --      Exclude from Growth Chart --    No data found.  Updated Vital Signs BP (!) 144/76 (BP Location: Right Arm)   Pulse 74   Temp (!) 97.5 F (36.4 C) (Oral)   Resp 18   SpO2 96%   Visual Acuity Right Eye Distance:   Left Eye Distance:   Bilateral Distance:    Right Eye Near:   Left Eye Near:    Bilateral Near:     Physical Exam Vitals and nursing note reviewed.  Constitutional:      General: She is not in acute distress.    Appearance: Normal appearance. She is not ill-appearing or toxic-appearing.  HENT:     Head: Normocephalic and atraumatic.     Right Ear: Tympanic membrane, ear canal and external ear normal. No drainage, swelling or tenderness. No middle ear effusion. Tympanic membrane is not erythematous.     Left Ear: Tympanic membrane, ear canal and external ear normal. No drainage, swelling or tenderness.  No middle ear effusion. Tympanic membrane is not erythematous.     Nose: Congestion present. No rhinorrhea.     Right Sinus: Maxillary sinus tenderness and frontal sinus tenderness present.     Left Sinus: Maxillary sinus tenderness and frontal sinus tenderness present.     Mouth/Throat:     Mouth: Mucous membranes are moist.     Pharynx: Oropharynx is clear. Posterior oropharyngeal erythema present. No oropharyngeal exudate.   Eyes:     General: No scleral icterus.    Extraocular Movements: Extraocular movements intact.    Cardiovascular:     Rate and Rhythm: Normal rate and regular rhythm.  Pulmonary:     Effort: Pulmonary effort is normal. No respiratory distress.     Breath sounds: Normal breath sounds. No wheezing, rhonchi or rales.   Abdominal:     General: Bowel sounds are normal.   Musculoskeletal:     Cervical back: Normal range of motion and neck supple.  Lymphadenopathy:     Cervical: No cervical adenopathy.   Skin:    General: Skin is warm and dry.     Capillary Refill: Capillary refill takes less than 2 seconds.     Coloration: Skin is not jaundiced or pale.     Findings: No erythema or rash.   Neurological:     Mental Status: She is alert and oriented to person, place, and time.   Psychiatric:        Behavior: Behavior is cooperative.      UC Treatments / Results  Labs (all labs ordered are listed, but only abnormal results are displayed) Labs Reviewed - No data to display  EKG   Radiology No results found.  Procedures Procedures (including critical care time)  Medications Ordered in UC Medications - No data to display  Initial Impression / Assessment and Plan / UC Course  I have reviewed the triage vital signs and the nursing notes.  Pertinent labs & imaging results that were available during my care of the patient were reviewed by me and considered in my medical decision making (see chart for details).   Patient is well-appearing, normotensive, afebrile, not tachycardic, not tachypneic, oxygenating well on room air.   1. Bacterial sinusitis Treat with doxycycline twice daily for 7  days Continue supportive care with saline nasal spray and guaifenesin   The patient was given the opportunity to ask questions.  All questions answered to their satisfaction.  The patient is in agreement to this plan.   Final Clinical Impressions(s) / UC Diagnoses   Final diagnoses:  Bacterial sinusitis     Discharge Instructions      Take the doxycycline as prescribed to treat the sinus infection. Continue Mucinex, hydration, and saline nasal spray.  Seek care if symptoms do not improve with treatment.   ED Prescriptions     Medication Sig Dispense Auth. Provider   doxycycline (VIBRAMYCIN)  100 MG capsule Take 1 capsule (100 mg total) by mouth 2 (two) times daily for 7 days. 14 capsule Wilhemena Harbour, NP      PDMP not reviewed this encounter.   Wilhemena Harbour, NP 12/02/23 1433

## 2023-12-02 NOTE — Discharge Instructions (Signed)
 Take the doxycycline as prescribed to treat the sinus infection. Continue Mucinex, hydration, and saline nasal spray.  Seek care if symptoms do not improve with treatment.

## 2023-12-23 DIAGNOSIS — H01004 Unspecified blepharitis left upper eyelid: Secondary | ICD-10-CM | POA: Diagnosis not present

## 2023-12-23 DIAGNOSIS — H02831 Dermatochalasis of right upper eyelid: Secondary | ICD-10-CM | POA: Diagnosis not present

## 2023-12-23 DIAGNOSIS — H01005 Unspecified blepharitis left lower eyelid: Secondary | ICD-10-CM | POA: Diagnosis not present

## 2023-12-23 DIAGNOSIS — H01001 Unspecified blepharitis right upper eyelid: Secondary | ICD-10-CM | POA: Diagnosis not present

## 2023-12-23 DIAGNOSIS — Z961 Presence of intraocular lens: Secondary | ICD-10-CM | POA: Diagnosis not present

## 2023-12-23 DIAGNOSIS — H353132 Nonexudative age-related macular degeneration, bilateral, intermediate dry stage: Secondary | ICD-10-CM | POA: Diagnosis not present

## 2023-12-23 DIAGNOSIS — H02834 Dermatochalasis of left upper eyelid: Secondary | ICD-10-CM | POA: Diagnosis not present

## 2023-12-23 DIAGNOSIS — H16223 Keratoconjunctivitis sicca, not specified as Sjogren's, bilateral: Secondary | ICD-10-CM | POA: Diagnosis not present

## 2023-12-23 DIAGNOSIS — H179 Unspecified corneal scar and opacity: Secondary | ICD-10-CM | POA: Diagnosis not present

## 2023-12-23 DIAGNOSIS — H43813 Vitreous degeneration, bilateral: Secondary | ICD-10-CM | POA: Diagnosis not present

## 2023-12-23 DIAGNOSIS — H01002 Unspecified blepharitis right lower eyelid: Secondary | ICD-10-CM | POA: Diagnosis not present

## 2023-12-29 ENCOUNTER — Ambulatory Visit
Admission: RE | Admit: 2023-12-29 | Discharge: 2023-12-29 | Disposition: A | Discharge status: Home / Self Care | Source: Ambulatory Visit | Attending: Family Medicine | Admitting: Family Medicine

## 2023-12-29 VITALS — BP 178/78 | HR 75 | Temp 97.7°F | Resp 18

## 2023-12-29 DIAGNOSIS — R109 Unspecified abdominal pain: Secondary | ICD-10-CM | POA: Diagnosis not present

## 2023-12-29 LAB — POCT URINALYSIS DIP (MANUAL ENTRY)
Bilirubin, UA: NEGATIVE
Blood, UA: NEGATIVE
Glucose, UA: NEGATIVE mg/dL
Leukocytes, UA: NEGATIVE
Nitrite, UA: NEGATIVE
Protein Ur, POC: NEGATIVE mg/dL
Spec Grav, UA: 1.025 (ref 1.010–1.025)
Urobilinogen, UA: 0.2 U/dL
pH, UA: 5.5 (ref 5.0–8.0)

## 2023-12-29 MED ORDER — TAMSULOSIN HCL 0.4 MG PO CAPS: 0.4000 mg | ORAL_CAPSULE | Freq: Every day | ORAL | 0 refills | Status: DC

## 2023-12-29 MED ORDER — KETOROLAC TROMETHAMINE 30 MG/ML IJ SOLN
30.0000 mg | Freq: Once | INTRAMUSCULAR | Status: AC
  Administered 2023-12-29: 30 mg via INTRAMUSCULAR

## 2023-12-29 NOTE — Discharge Instructions (Signed)
 Your pain may be caused by a kidney stone.  There was no evidence of this in your urine today but without a CT scan we cannot rule this out and it is most consistent with your symptoms.  We have given you a medication called Flomax  that can help dilate the urinary tubes which could help a stone pass and a shot of Toradol  which is a strong anti-inflammatory pain medication similar to ibuprofen.  Do not take any ibuprofen or Aleve for the next 48 hours but you may take Tylenol  as needed for pain in that period of time.  Go to the emergency department for severe worsening symptoms, follow-up with primary care soon as possible.

## 2023-12-29 NOTE — ED Triage Notes (Signed)
 Pt reports lower back pain on the right side onset this morning.

## 2023-12-29 NOTE — ED Provider Notes (Signed)
 [facility]    CSN: [id number] Arrival date & time: 12/29/23  1445      History   Chief Complaint Chief Complaint  Patient presents with   Back Pain    I think it might be a kidney stone. - Entered by patient    HPI Tiffany Kim is a 83 y.o. female.   Patient presenting today with new onset severe right flank pain that started this morning while playing cards.  She states the pain will come on randomly, last for quite some time and then resolve spontaneously.  No exacerbating or relieving factors identified and denies any associated fever, chills, abdominal pain, nausea vomiting diarrhea or constipation, urinary symptoms, injury, radiation of pain, bowel or bladder incontinence.  So far not trying anything over-the-counter for symptoms.  No past history of similar issues.    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   Pessary maintenance, Gelhorn 2 1/2 inch placed 01/21/21 05/20/2021   Cystocele and rectocele with incomplete uterovaginal prolapse 12/26/2020   Rectocele 12/26/2020   Prolapse of vaginal wall with midline cystocele 12/26/2020   Hematuria 10/18/2020   Urinary frequency 10/18/2020   Mixed stress and urge urinary incontinence 08/12/2016   Primary osteoarthritis of first carpometacarpal joint of right hand 11/20/2015   Dizziness 08/22/2014   Hypertension 08/22/2014   Current use of estrogen therapy 08/22/2014   Elevated blood pressure 08/28/2013   Precordial pain 08/25/2013   HYPOTHYROIDISM 11/05/2008    Past Surgical History:   Procedure Laterality Date   ABDOMINAL HYSTERECTOMY     BREAST BIOPSY     BROW LIFT Bilateral 03/29/2020   Procedure: BLEPHAROPLASTY;  Surgeon: Harrie Agent, MD;  Location: [facility];  Service: Ophthalmology;  Laterality: Bilateral;   BUNIONECTOMY  10/22/2011   Procedure: ROMAYNE;  Surgeon: Morene Donley Anon, DPM;  Location: [facility];  Service: Orthopedics;  Laterality: Right;  [place] Bunionectomy Right Foot   CHOLECYSTECTOMY     FOOT SURGERY     HAMMER TOE SURGERY  10/22/2011   Procedure: HAMMER TOE CORRECTION;  Surgeon: Morene Donley Anon, DPM;  Location: [facility];  Service: Orthopedics;  Laterality: Right;  Arthroplasty 5th Toe Right Foot   LAPAROSCOPIC APPENDECTOMY N/A 12/22/2013   Procedure: APPENDECTOMY LAPAROSCOPIC;  Surgeon: Oneil DELENA Budge, MD;  Location: [facility];  Service: General;  Laterality: N/A;   METATARSAL OSTEOTOMY  10/22/2011   Procedure: METATARSAL OSTEOTOMY;  Surgeon: Morene Donley Anon, DPM;  Location: [facility];  Service: Orthopedics;  Laterality: Right;  Possible Aiken Osteotomy Right Foot   THYROIDECTOMY      OB History     Gravida  2   Para  2   Term  2   Preterm      AB      Living  2      SAB      IAB      Ectopic      Multiple      Live Births  2  Home Medications    Prior to Admission medications   Medication Sig Start Date End Date Taking? Authorizing Provider  tamsulosin  (FLOMAX ) 0.4 MG CAPS capsule Take 1 capsule (0.4 mg total) by mouth daily. 12/29/23  Yes Stuart Vernell Norris, PA-C  albuterol  (VENTOLIN  HFA) 108 (90 Base) MCG/ACT inhaler Inhale 1-2 puffs into the lungs every 6 (six) hours as needed for wheezing or shortness of breath. 10/13/23   Chandra Harlene LABOR, NP  B Complex-C (SUPER B COMPLEX PO) Take 1 capsule by mouth daily.     [provider]  Cholecalciferol (VITAMIN D PO) Take 2,000 Units by mouth daily.     [provider]  estradiol  (CLIMARA  - DOSED IN MG/24 HR) 0.05 mg/24hr patch  APPLY ONE PATCH WEEKLY. 06/22/22   Signa Nest A, NP  Fexofenadine HCl (ALLEGRA PO) Take by mouth.    [provider]  levothyroxine (SYNTHROID) 75 MCG tablet Take 75 mcg by mouth daily. 10/13/23   [provider]  losartan (COZAAR) 50 MG tablet Take 50 mg by mouth daily.  07/16/16   [provider]  Multiple Vitamin (MULTIVITAMIN WITH MINERALS) TABS tablet Take 1 tablet by mouth daily.    [provider]  Multiple Vitamins-Minerals (PRESERVISION AREDS PO) Take 1 capsule by mouth in the morning and at bedtime.     [provider]  Polyethyl Glycol-Propyl Glycol (SYSTANE OP) Place 1 drop into both eyes 2 (two) times daily as needed (dry eyes).    [provider]  sodium chloride  (OCEAN) 0.65 % SOLN nasal spray Place 1 spray into both nostrils daily.    [provider]    Family History Family History  Problem Relation Age of Onset   Stroke Paternal Grandmother    Cancer Maternal Grandfather        pancreatic   Heart attack Father        Age 32   Heart attack Mother        Late 32s   Cancer Mother        colon   Hypertension Brother    Macular degeneration Brother    COPD Sister    Breast cancer Sister    Macular degeneration Sister     Social History Social History   Tobacco Use   Smoking status: Former    Types: Cigarettes   Smokeless tobacco: Never  Vaping Use   Vaping status: Never Used  Substance Use Topics   Alcohol use: Yes    Alcohol/week: 2.0 standard drinks of alcohol    Types: 2 Glasses of wine per week    Comment: wine every night   Drug use: No     Allergies   Codeine and Other   Review of Systems Review of Systems Per HPI  Physical Exam Triage Vital Signs ED Triage Vitals  Encounter Vitals Group     BP 12/29/23 1449 (!) 178/78     Girls Systolic BP Percentile --      Girls Diastolic BP Percentile --      Boys Systolic BP Percentile --      Boys Diastolic BP Percentile --       Pulse Rate 12/29/23 1449 75     Resp 12/29/23 1449 18     Temp 12/29/23 1449 97.7 F (36.5 C)     Temp Source 12/29/23 1449 Oral     SpO2 12/29/23 1449 95 %     Weight --      Height --  Head Circumference --      Peak Flow --      Pain Score 12/29/23 1451 8     Pain Loc --      Pain Education --      Exclude from Growth Chart --    No data found.  Updated Vital Signs BP (!) 178/78 (BP Location: Right Arm)   Pulse 75   Temp 97.7 F (36.5 C) (Oral)   Resp 18   SpO2 95%   Visual Acuity Right Eye Distance:   Left Eye Distance:   Bilateral Distance:    Right Eye Near:   Left Eye Near:    Bilateral Near:     Physical Exam Vitals and nursing note reviewed.  Constitutional:      Appearance: Normal appearance. She is not ill-appearing.  HENT:     Head: Atraumatic.  Eyes:     Extraocular Movements: Extraocular movements intact.     Conjunctiva/sclera: Conjunctivae normal.  Cardiovascular:     Rate and Rhythm: Normal rate and regular rhythm.     Heart sounds: Normal heart sounds.  Pulmonary:     Effort: Pulmonary effort is normal.     Breath sounds: Normal breath sounds.  Abdominal:     General: Bowel sounds are normal. There is no distension.     Palpations: Abdomen is soft.     Tenderness: There is no abdominal tenderness. There is no right CVA tenderness, left CVA tenderness or guarding.  Musculoskeletal:        General: No swelling, tenderness, deformity or signs of injury. Normal range of motion.     Cervical back: Normal range of motion and neck supple.     Comments: No midline spinal tenderness to palpation diffusely.  Negative straight leg raise bilateral lower extremities.  Normal gait and range of motion  Skin:    General: Skin is warm and dry.  Neurological:     Mental Status: She is alert and oriented to person, place, and time.     Motor: No weakness.     Gait: Gait normal.     Comments: Bilateral lower extremities neurovascularly intact   Psychiatric:        Mood and Affect: Mood normal.        Thought Content: Thought content normal.        Judgment: Judgment normal.    UC Treatments / Results  Labs (all labs ordered are listed, but only abnormal results are displayed) Labs Reviewed  POCT URINALYSIS DIP (MANUAL ENTRY) - Abnormal; Notable for the following components:      Result Value   Clarity, UA cloudy (*)    Ketones, POC UA trace (5) (*)    All other components within normal limits    EKG   Radiology No results found.  Procedures Procedures (including critical care time)  Medications Ordered in UC Medications  ketorolac  (TORADOL ) 30 MG/ML injection 30 mg (30 mg Intramuscular Given 12/29/23 1623)    Initial Impression / Assessment and Plan / UC Course  I have reviewed the triage vital signs and the nursing notes.  Pertinent labs & imaging results that were available during my care of the patient were reviewed by me and considered in my medical decision making (see chart for details).     Hypertensive in triage, otherwise vital signs reassuring.  Exam without obvious abnormality, urinalysis with no evidence of urinary tract infection, or kidney stone and exam not consistent with musculoskeletal injury.  Do suspect kidney  stone despite hematuria given symptoms, trial IM Toradol , Flomax , fluids and close PCP follow-up.  ED for worsening symptoms.  Unable to confirm as we do not have CT scanner resources here.  Patient agreeable to plan.  Final Clinical Impressions(s) / UC Diagnoses   Final diagnoses:  Right flank pain     Discharge Instructions      Your pain may be caused by a kidney stone.  There was no evidence of this in your urine today but without a CT scan we cannot rule this out and it is most consistent with your symptoms.  We have given you a medication called Flomax  that can help dilate the urinary tubes which could help a stone pass and a shot of Toradol  which is a strong  anti-inflammatory pain medication similar to ibuprofen.  Do not take any ibuprofen or Aleve for the next 48 hours but you may take Tylenol  as needed for pain in that period of time.  Go to the emergency department for severe worsening symptoms, follow-up with primary care soon as possible.    ED Prescriptions     Medication Sig Dispense Auth. Provider   tamsulosin  (FLOMAX ) 0.4 MG CAPS capsule Take 1 capsule (0.4 mg total) by mouth daily. 14 capsule Stuart Vernell Norris, [place]      PDMP not reviewed this encounter.   Stuart Vernell Norris, PA-C 12/29/23 1705

## 2023-12-29 NOTE — ED Notes (Signed)
 Patient continues to drink water to obtain a urine specimen

## 2023-12-30 ENCOUNTER — Other Ambulatory Visit (HOSPITAL_COMMUNITY): Payer: Self-pay | Admitting: Internal Medicine

## 2023-12-30 DIAGNOSIS — R109 Unspecified abdominal pain: Secondary | ICD-10-CM

## 2023-12-31 ENCOUNTER — Ambulatory Visit (HOSPITAL_COMMUNITY)
Admission: RE | Admit: 2023-12-31 | Discharge: 2023-12-31 | Disposition: A | Discharge status: Home / Self Care | Source: Ambulatory Visit | Attending: Internal Medicine | Admitting: Internal Medicine

## 2023-12-31 DIAGNOSIS — R109 Unspecified abdominal pain: Secondary | ICD-10-CM | POA: Diagnosis not present

## 2023-12-31 DIAGNOSIS — I728 Aneurysm of other specified arteries: Secondary | ICD-10-CM | POA: Diagnosis not present

## 2023-12-31 DIAGNOSIS — K5710 Diverticulosis of small intestine without perforation or abscess without bleeding: Secondary | ICD-10-CM | POA: Diagnosis not present

## 2024-01-07 DIAGNOSIS — R7301 Impaired fasting glucose: Secondary | ICD-10-CM | POA: Diagnosis not present

## 2024-01-07 DIAGNOSIS — I10 Essential (primary) hypertension: Secondary | ICD-10-CM | POA: Diagnosis not present

## 2024-01-18 DIAGNOSIS — G43109 Migraine with aura, not intractable, without status migrainosus: Secondary | ICD-10-CM | POA: Diagnosis not present

## 2024-01-18 DIAGNOSIS — R011 Cardiac murmur, unspecified: Secondary | ICD-10-CM | POA: Diagnosis not present

## 2024-01-18 DIAGNOSIS — N8110 Cystocele, unspecified: Secondary | ICD-10-CM | POA: Diagnosis not present

## 2024-01-18 DIAGNOSIS — E785 Hyperlipidemia, unspecified: Secondary | ICD-10-CM | POA: Diagnosis not present

## 2024-01-18 DIAGNOSIS — K5909 Other constipation: Secondary | ICD-10-CM | POA: Diagnosis not present

## 2024-01-18 DIAGNOSIS — K219 Gastro-esophageal reflux disease without esophagitis: Secondary | ICD-10-CM | POA: Diagnosis not present

## 2024-01-18 DIAGNOSIS — E039 Hypothyroidism, unspecified: Secondary | ICD-10-CM | POA: Diagnosis not present

## 2024-01-18 DIAGNOSIS — I10 Essential (primary) hypertension: Secondary | ICD-10-CM | POA: Diagnosis not present

## 2024-01-18 DIAGNOSIS — I728 Aneurysm of other specified arteries: Secondary | ICD-10-CM | POA: Diagnosis not present

## 2024-01-18 DIAGNOSIS — I700 Atherosclerosis of aorta: Secondary | ICD-10-CM | POA: Diagnosis not present

## 2024-01-18 DIAGNOSIS — D7589 Other specified diseases of blood and blood-forming organs: Secondary | ICD-10-CM | POA: Diagnosis not present

## 2024-01-18 DIAGNOSIS — N951 Menopausal and female climacteric states: Secondary | ICD-10-CM | POA: Diagnosis not present

## 2024-02-03 DIAGNOSIS — J324 Chronic pansinusitis: Secondary | ICD-10-CM | POA: Diagnosis not present

## 2024-02-04 ENCOUNTER — Encounter: Payer: Self-pay | Admitting: Radiology

## 2024-02-22 DIAGNOSIS — J324 Chronic pansinusitis: Secondary | ICD-10-CM | POA: Diagnosis not present

## 2024-03-07 DIAGNOSIS — K529 Noninfective gastroenteritis and colitis, unspecified: Secondary | ICD-10-CM | POA: Diagnosis not present

## 2024-03-08 DIAGNOSIS — K529 Noninfective gastroenteritis and colitis, unspecified: Secondary | ICD-10-CM | POA: Diagnosis not present

## 2024-03-27 ENCOUNTER — Institutional Professional Consult (permissible substitution) (INDEPENDENT_AMBULATORY_CARE_PROVIDER_SITE_OTHER): Admitting: Otolaryngology

## 2024-04-17 ENCOUNTER — Encounter: Payer: Self-pay | Admitting: Radiology

## 2024-06-20 ENCOUNTER — Ambulatory Visit (INDEPENDENT_AMBULATORY_CARE_PROVIDER_SITE_OTHER): Admitting: Adult Health

## 2024-06-20 ENCOUNTER — Encounter: Payer: Self-pay | Admitting: Adult Health

## 2024-06-20 VITALS — BP 138/75 | HR 66 | Ht 63.0 in | Wt 121.0 lb

## 2024-06-20 DIAGNOSIS — N6321 Unspecified lump in the left breast, upper outer quadrant: Secondary | ICD-10-CM | POA: Diagnosis not present

## 2024-06-20 NOTE — Progress Notes (Signed)
" °  Subjective:     Patient ID: Tiffany Kim, female   DOB: March 15, 1941, 84 y.o.   MRN: [id number]  HPI Tiffany Kim is a 84 year old white female, widowed, sp hysterectomy in having found a lump in left breast before Christmas.  It is non tender, and has no known injury. She had last mammogram in 2023, she says they hurt.   PCP is Dr Shona  Review of Systems +Left breast lump   Reviewed past medical,surgical, social and family history. Reviewed medications and allergies.  Objective:   Physical Exam BP 138/75 (BP Location: Left Arm, Patient Position: Sitting, Cuff Size: Normal)   Pulse 66   Ht 5' 3 (1.6 m)   Wt 121 lb (54.9 kg)   BMI 21.43 kg/m      Skin warm and dry,  Breasts:no dominate palpable mass, retraction or nipple discharge on the right, on the left, no retraction or nipple discharge, has a firm, non tender mass at 1 0' clock about 2 FB from areola, feels to be 1-2 cm. Fall risk is low  [facility] - 06/20/24 1054       Pregnancy Intention Screening   Does the patient want to become pregnant in the next year? N/A    Does the patient's partner want to become pregnant in the next year? N/A    Would the patient like to discuss contraceptive options today? N/A      Contraception Wrap Up   Current Method Abstinence;Hysterectomy    End Method Abstinence;Hysterectomy    Contraception Counseling Provided No          Assessment:     1. Mass of upper outer quadrant of left breast (Primary) +firm, non tender mass at 1 0' clock about 2 FB from areola, feels to be 1-2 cm. Diagnostic mammogram and US  scheduled at [facility] 06/29/24 at 2:20 pm, she will talk with daughter who works for [employer] about this too.  - US  LIMITED ULTRASOUND INCLUDING AXILLA RIGHT BREAST; Future - MM 3D DIAGNOSTIC MAMMOGRAM BILATERAL BREAST; Future - US  LIMITED ULTRASOUND INCLUDING AXILLA LEFT BREAST ; Future     Plan:     Follow up prn     "

## 2024-06-29 ENCOUNTER — Other Ambulatory Visit (HOSPITAL_COMMUNITY): Payer: Self-pay | Admitting: Adult Health

## 2024-06-29 ENCOUNTER — Ambulatory Visit (HOSPITAL_COMMUNITY)
Admission: RE | Admit: 2024-06-29 | Discharge: 2024-06-29 | Disposition: A | Source: Ambulatory Visit | Attending: Adult Health | Admitting: Adult Health

## 2024-06-29 ENCOUNTER — Ambulatory Visit (HOSPITAL_COMMUNITY)
Admission: RE | Admit: 2024-06-29 | Discharge: 2024-06-29 | Disposition: A | Discharge status: Home / Self Care | Source: Ambulatory Visit | Attending: Adult Health | Admitting: Adult Health

## 2024-06-29 ENCOUNTER — Encounter (HOSPITAL_COMMUNITY): Payer: Self-pay

## 2024-06-29 DIAGNOSIS — N6321 Unspecified lump in the left breast, upper outer quadrant: Secondary | ICD-10-CM

## 2024-06-29 DIAGNOSIS — R928 Other abnormal and inconclusive findings on diagnostic imaging of breast: Secondary | ICD-10-CM

## 2024-06-30 ENCOUNTER — Ambulatory Visit: Payer: Self-pay | Admitting: Adult Health

## 2024-07-10 ENCOUNTER — Other Ambulatory Visit (HOSPITAL_COMMUNITY): Payer: Self-pay | Admitting: Adult Health

## 2024-07-10 DIAGNOSIS — R928 Other abnormal and inconclusive findings on diagnostic imaging of breast: Secondary | ICD-10-CM

## 2024-07-11 ENCOUNTER — Inpatient Hospital Stay (HOSPITAL_COMMUNITY): Admission: RE | Admit: 2024-07-11 | Discharge: 2024-07-11 | Attending: Adult Health | Admitting: Adult Health

## 2024-07-11 ENCOUNTER — Ambulatory Visit (HOSPITAL_COMMUNITY)
Admission: RE | Admit: 2024-07-11 | Discharge: 2024-07-11 | Disposition: A | Discharge status: Home / Self Care | Source: Ambulatory Visit | Attending: Adult Health | Admitting: Adult Health

## 2024-07-11 ENCOUNTER — Encounter (HOSPITAL_COMMUNITY): Payer: Self-pay

## 2024-07-11 DIAGNOSIS — R928 Other abnormal and inconclusive findings on diagnostic imaging of breast: Secondary | ICD-10-CM | POA: Insufficient documentation

## 2024-07-11 MED ORDER — LIDOCAINE-EPINEPHRINE (PF) 1 %-1:200000 IJ SOLN
30.0000 mL | Freq: Once | INTRAMUSCULAR | Status: AC
  Administered 2024-07-11: 10 mL via INTRADERMAL

## 2024-07-11 MED ORDER — LIDOCAINE HCL (PF) 2 % IJ SOLN
10.0000 mL | Freq: Once | INTRAMUSCULAR | Status: AC
  Administered 2024-07-11: 10 mL via INTRADERMAL

## 2024-07-11 MED ORDER — LIDOCAINE-EPINEPHRINE (PF) 1 %-1:200000 IJ SOLN
INTRAMUSCULAR | Status: AC
  Filled 2024-07-11: qty 30

## 2024-07-11 MED ORDER — LIDOCAINE HCL (PF) 2 % IJ SOLN
INTRAMUSCULAR | Status: AC
  Filled 2024-07-11: qty 10

## 2024-07-11 NOTE — Progress Notes (Signed)
 Patient brought to US  Rm 3 in no obvious distress. Breast biopsy explained. Consent obtained. Prepped and draped in sterile fashion. Local anesthetic admin without adverse event. Access obtained under US  imaging. Samples obtained and secured in solution. Access withdrawn without difficulty. Bandage placed, no bleeding or hematoma obvious. DC instructions provided with discussion re: pain management and swelling, use of ice. Escorted to mammogram for imaging.

## 2024-07-12 ENCOUNTER — Other Ambulatory Visit: Payer: Self-pay | Admitting: Adult Health

## 2024-07-12 ENCOUNTER — Ambulatory Visit: Payer: Self-pay | Admitting: Adult Health

## 2024-07-12 DIAGNOSIS — R928 Other abnormal and inconclusive findings on diagnostic imaging of breast: Secondary | ICD-10-CM

## 2024-07-12 LAB — SURGICAL PATHOLOGY

## 2024-07-12 NOTE — Telephone Encounter (Signed)
 Called pt to let her know no malignancy on biopsy. Benign breast tissue with fibrocystic changes, fibrosis,cysts and calcifications.

## 2024-07-14 ENCOUNTER — Ambulatory Visit: Payer: Self-pay | Admitting: Adult Health

## 2024-07-14 ENCOUNTER — Other Ambulatory Visit: Payer: Self-pay | Admitting: Adult Health

## 2024-07-14 ENCOUNTER — Telehealth: Payer: Self-pay | Admitting: Adult Health

## 2024-07-14 DIAGNOSIS — N6321 Unspecified lump in the left breast, upper outer quadrant: Secondary | ICD-10-CM

## 2024-07-14 NOTE — Progress Notes (Signed)
 Marlaina wants referral to [facility] in [place], phone [phone] fax [phone]

## 2024-07-18 ENCOUNTER — Ambulatory Visit (HOSPITAL_COMMUNITY): Admission: RE | Admit: 2024-07-18 | Source: Ambulatory Visit

## 2024-07-19 ENCOUNTER — Ambulatory Visit
Admission: RE | Admit: 2024-07-19 | Discharge: 2024-07-19 | Disposition: A | Source: Ambulatory Visit | Attending: Adult Health | Admitting: Adult Health

## 2024-07-19 DIAGNOSIS — R928 Other abnormal and inconclusive findings on diagnostic imaging of breast: Secondary | ICD-10-CM

## 2024-07-20 ENCOUNTER — Other Ambulatory Visit (HOSPITAL_COMMUNITY): Payer: Self-pay | Admitting: Internal Medicine

## 2024-07-20 DIAGNOSIS — Z13820 Encounter for screening for osteoporosis: Secondary | ICD-10-CM

## 2024-07-20 DIAGNOSIS — M810 Age-related osteoporosis without current pathological fracture: Secondary | ICD-10-CM

## 2024-07-20 LAB — SURGICAL PATHOLOGY
